# Patient Record
Sex: Female | Born: 1955 | Race: Black or African American | Hispanic: No | Marital: Married | Smoking: Former smoker
Health system: Southern US, Community
[De-identification: ages and names within clinical notes are randomized; demographics above are authoritative.]

## PROBLEM LIST (undated history)

## (undated) DIAGNOSIS — M069 Rheumatoid arthritis, unspecified: Secondary | ICD-10-CM

## (undated) DIAGNOSIS — T7840XA Allergy, unspecified, initial encounter: Secondary | ICD-10-CM

## (undated) DIAGNOSIS — M779 Enthesopathy, unspecified: Secondary | ICD-10-CM

## (undated) DIAGNOSIS — R7303 Prediabetes: Secondary | ICD-10-CM

## (undated) HISTORY — DX: Allergy, unspecified, initial encounter: T78.40XA

## (undated) HISTORY — PX: TUMOR REMOVAL: SHX12

## (undated) HISTORY — PX: WISDOM TOOTH EXTRACTION: SHX21

## (undated) HISTORY — PX: TONSILLECTOMY: SUR1361

## (undated) HISTORY — PX: HERNIA REPAIR: SHX51

---

## 2000-06-09 ENCOUNTER — Encounter: Payer: Self-pay | Admitting: *Deleted

## 2000-06-09 ENCOUNTER — Ambulatory Visit (HOSPITAL_COMMUNITY): Admission: RE | Admit: 2000-06-09 | Discharge: 2000-06-09 | Payer: Self-pay | Admitting: *Deleted

## 2001-06-29 ENCOUNTER — Ambulatory Visit (HOSPITAL_COMMUNITY): Admission: RE | Admit: 2001-06-29 | Discharge: 2001-06-29 | Payer: Self-pay | Admitting: *Deleted

## 2001-06-29 ENCOUNTER — Encounter: Payer: Self-pay | Admitting: *Deleted

## 2002-02-02 ENCOUNTER — Other Ambulatory Visit: Admission: RE | Admit: 2002-02-02 | Discharge: 2002-02-02 | Payer: Self-pay | Admitting: *Deleted

## 2002-02-17 ENCOUNTER — Encounter: Payer: Self-pay | Admitting: Family Medicine

## 2002-02-17 ENCOUNTER — Encounter: Admission: RE | Admit: 2002-02-17 | Discharge: 2002-02-17 | Payer: Self-pay | Admitting: Family Medicine

## 2004-08-06 ENCOUNTER — Inpatient Hospital Stay (HOSPITAL_BASED_OUTPATIENT_CLINIC_OR_DEPARTMENT_OTHER): Admission: RE | Admit: 2004-08-06 | Discharge: 2004-08-06 | Payer: Self-pay | Admitting: Interventional Cardiology

## 2005-03-03 ENCOUNTER — Encounter: Admission: RE | Admit: 2005-03-03 | Discharge: 2005-03-03 | Payer: Self-pay | Admitting: Obstetrics and Gynecology

## 2005-04-01 ENCOUNTER — Other Ambulatory Visit: Admission: RE | Admit: 2005-04-01 | Discharge: 2005-04-01 | Payer: Self-pay | Admitting: Obstetrics and Gynecology

## 2005-05-06 ENCOUNTER — Encounter: Admission: RE | Admit: 2005-05-06 | Discharge: 2005-05-06 | Payer: Self-pay | Admitting: Obstetrics and Gynecology

## 2005-07-02 ENCOUNTER — Encounter: Admission: RE | Admit: 2005-07-02 | Discharge: 2005-07-02 | Payer: Self-pay | Admitting: General Surgery

## 2005-07-03 ENCOUNTER — Ambulatory Visit (HOSPITAL_BASED_OUTPATIENT_CLINIC_OR_DEPARTMENT_OTHER): Admission: RE | Admit: 2005-07-03 | Discharge: 2005-07-03 | Payer: Self-pay | Admitting: General Surgery

## 2005-07-03 ENCOUNTER — Ambulatory Visit (HOSPITAL_COMMUNITY): Admission: RE | Admit: 2005-07-03 | Discharge: 2005-07-03 | Payer: Self-pay | Admitting: General Surgery

## 2005-07-07 ENCOUNTER — Emergency Department (HOSPITAL_COMMUNITY): Admission: EM | Admit: 2005-07-07 | Discharge: 2005-07-07 | Payer: Self-pay | Admitting: Emergency Medicine

## 2005-10-09 ENCOUNTER — Encounter: Admission: RE | Admit: 2005-10-09 | Discharge: 2005-10-09 | Payer: Self-pay | Admitting: Obstetrics and Gynecology

## 2006-03-05 ENCOUNTER — Encounter: Admission: RE | Admit: 2006-03-05 | Discharge: 2006-03-05 | Payer: Self-pay | Admitting: Obstetrics and Gynecology

## 2006-09-23 ENCOUNTER — Ambulatory Visit: Payer: Self-pay | Admitting: Family Medicine

## 2006-10-06 ENCOUNTER — Ambulatory Visit: Payer: Self-pay | Admitting: Gastroenterology

## 2006-10-20 ENCOUNTER — Ambulatory Visit: Payer: Self-pay | Admitting: Gastroenterology

## 2006-12-22 IMAGING — CR DG CHEST 2V
2 series · 2 of 2 positions shown · non-contrast
Comparison: none

CLINICAL DATA: Preoperative respiratory exam.  Ventral hernia. Cough.
 CHEST ? TWO VIEWS:
 The heart size and mediastinal contours are within normal limits.  Both lungs are clear.  The visualized skeletal structures are unremarkable.

[w chest pa]
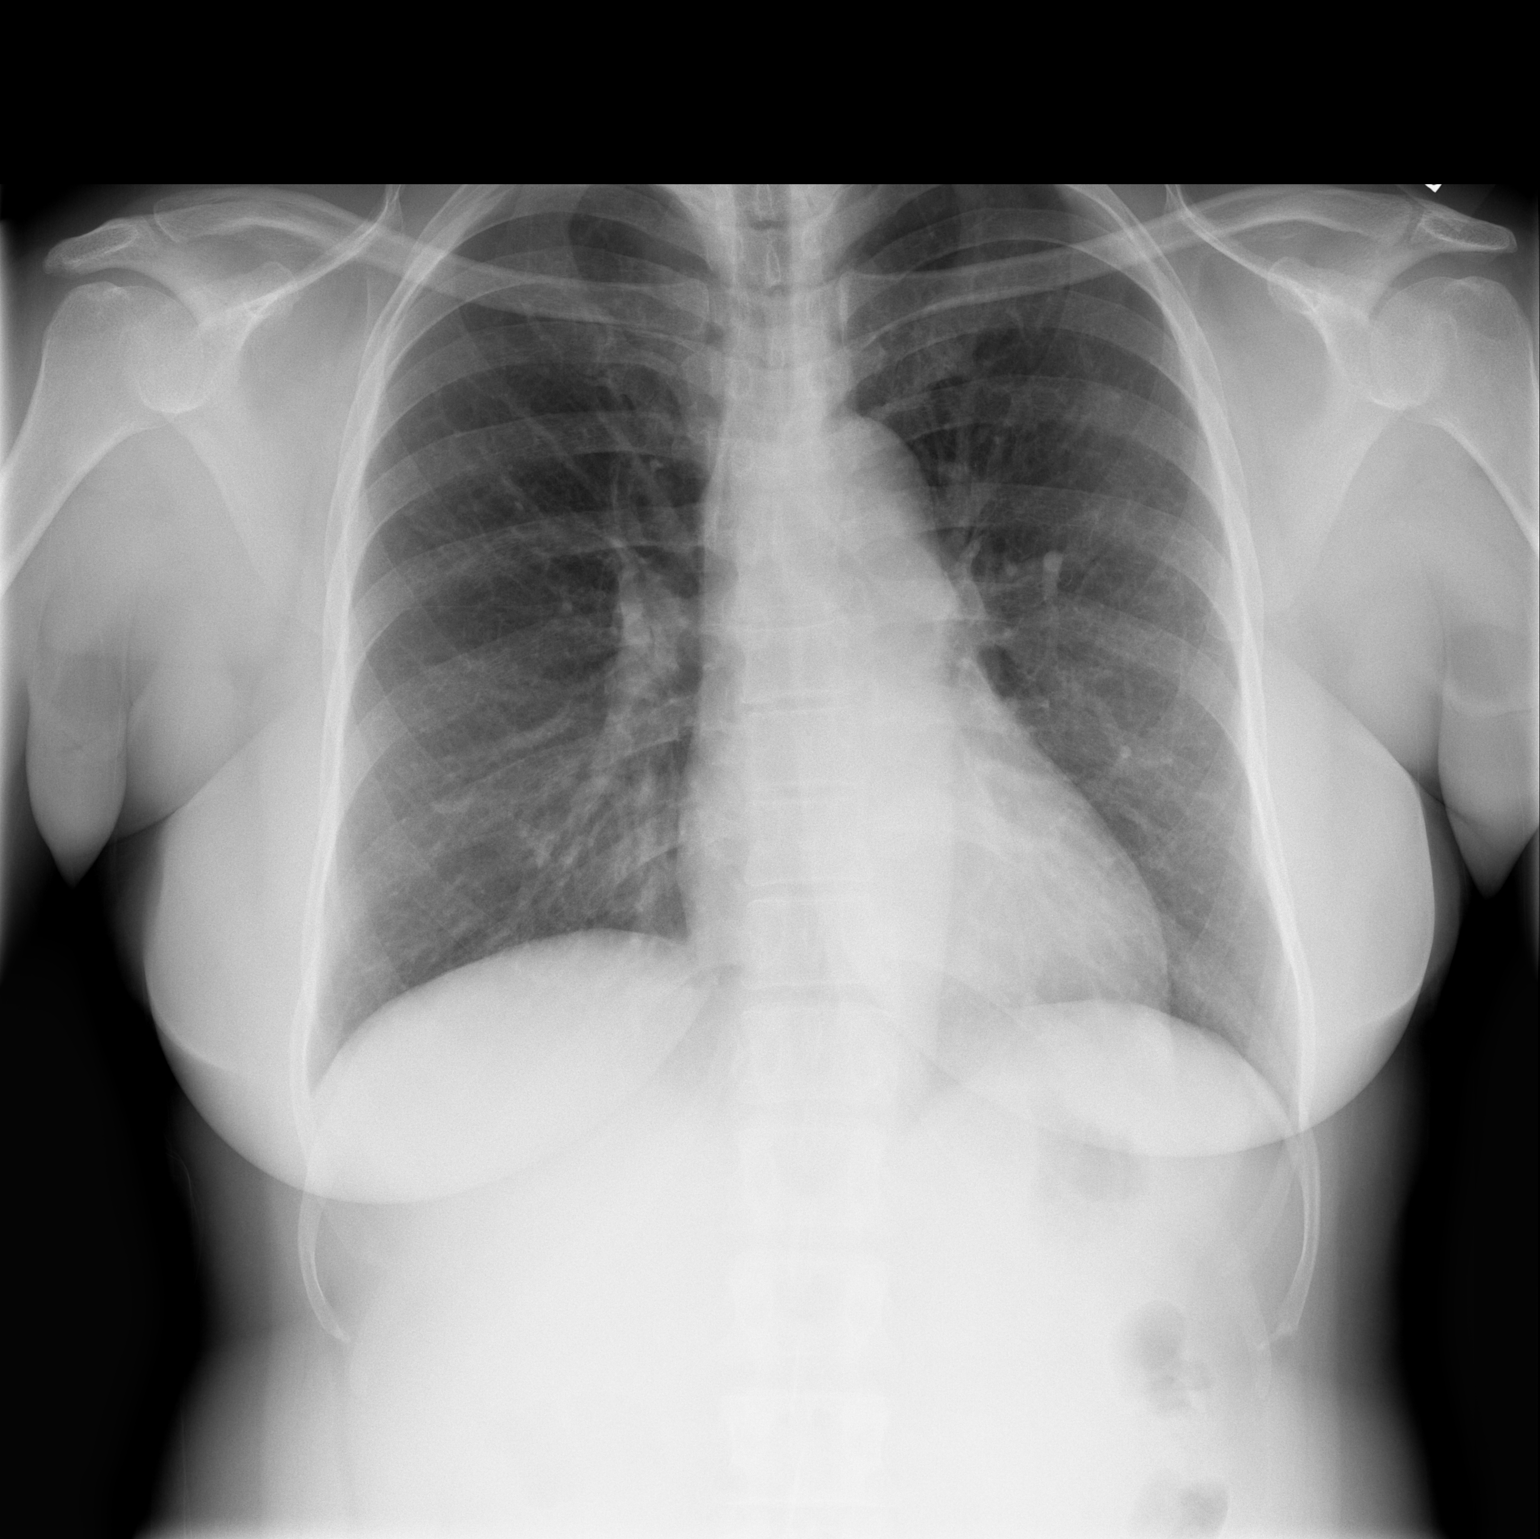

[w chest lat]
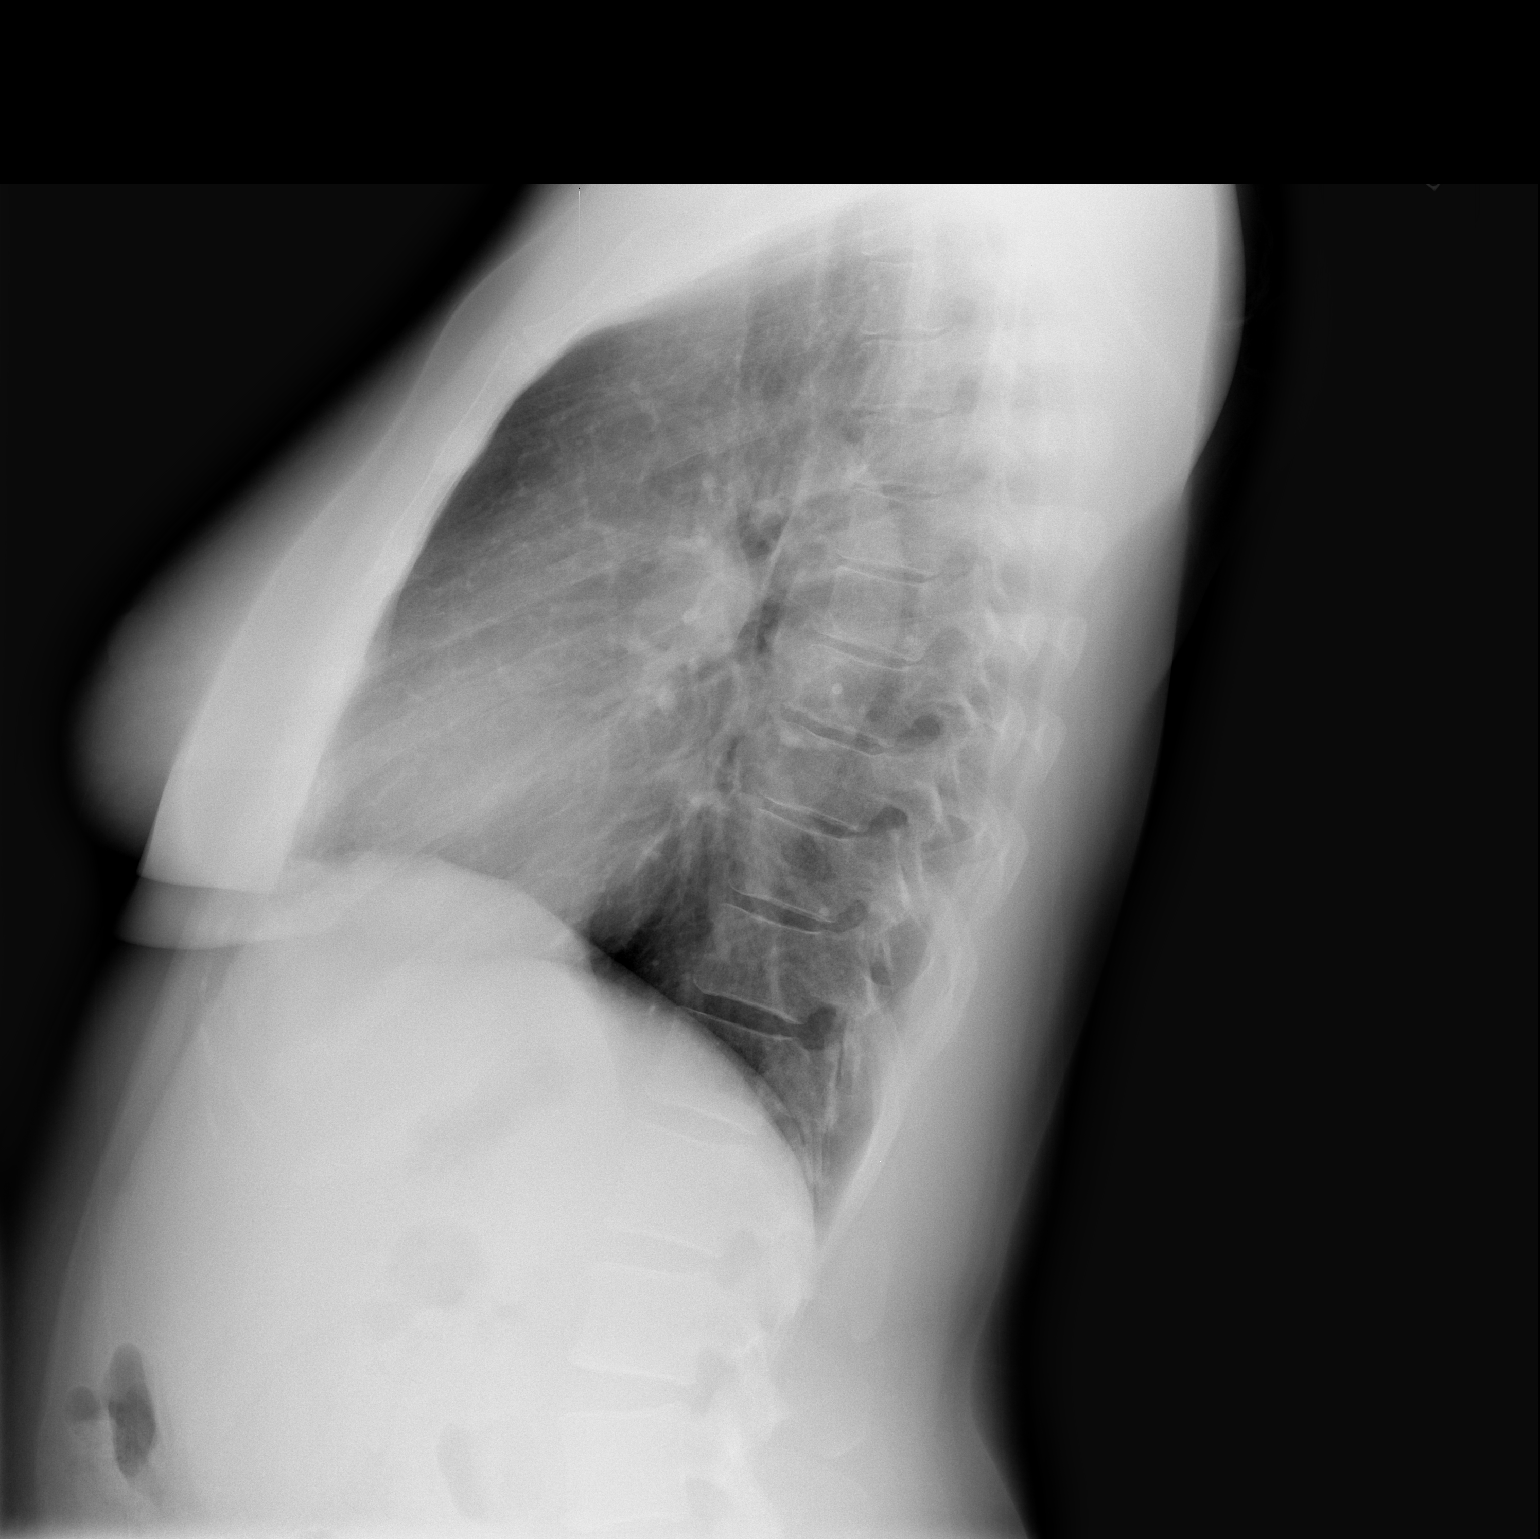

[2 of 2 positions shown; findings below may reference images not displayed]

IMPRESSION: No active cardiopulmonary disease.

## 2006-12-25 ENCOUNTER — Ambulatory Visit: Payer: Self-pay | Admitting: Family Medicine

## 2006-12-30 ENCOUNTER — Ambulatory Visit: Payer: Self-pay | Admitting: Family Medicine

## 2006-12-30 ENCOUNTER — Encounter: Admission: RE | Admit: 2006-12-30 | Discharge: 2006-12-30 | Payer: Self-pay | Admitting: Family Medicine

## 2006-12-30 LAB — CONVERTED CEMR LAB
Basophils Relative: 1.1 % — ABNORMAL HIGH (ref 0.0–1.0)
CO2: 29 meq/L (ref 19–32)
Calcium: 9.1 mg/dL (ref 8.4–10.5)
Creatinine, Ser: 0.6 mg/dL (ref 0.4–1.2)
Eosinophils Absolute: 0.2 10*3/uL (ref 0.0–0.6)
GFR calc Af Amer: 136 mL/min
GFR calc non Af Amer: 112 mL/min
Monocytes Absolute: 0.6 10*3/uL (ref 0.2–0.7)
Neutrophils Relative %: 59.5 % (ref 43.0–77.0)
Phosphorus: 3.6 mg/dL (ref 2.3–4.6)
Potassium: 4.2 meq/L (ref 3.5–5.1)
RBC: 4.85 M/uL (ref 3.87–5.11)
RDW: 13.3 % (ref 11.5–14.6)
WBC: 8.7 10*3/uL (ref 4.5–10.5)

## 2006-12-31 ENCOUNTER — Encounter (INDEPENDENT_AMBULATORY_CARE_PROVIDER_SITE_OTHER): Payer: Self-pay | Admitting: Family Medicine

## 2006-12-31 LAB — CONVERTED CEMR LAB
Hemoglobin, Urine: NEGATIVE
Ketones, ur: NEGATIVE mg/dL
Nitrite: NEGATIVE
Urine Glucose: NEGATIVE mg/dL
Urobilinogen, UA: 0.2 (ref 0.0–1.0)

## 2007-01-14 ENCOUNTER — Ambulatory Visit: Payer: Self-pay | Admitting: Family Medicine

## 2007-01-15 ENCOUNTER — Encounter (INDEPENDENT_AMBULATORY_CARE_PROVIDER_SITE_OTHER): Payer: Self-pay | Admitting: Family Medicine

## 2007-01-15 LAB — CONVERTED CEMR LAB
Bilirubin Urine: NEGATIVE
Protein, ur: NEGATIVE mg/dL
Specific Gravity, Urine: 1.027 (ref 1.005–1.03)
Urine Glucose: NEGATIVE mg/dL
Urobilinogen, UA: 0.2 (ref 0.0–1.0)

## 2007-01-16 ENCOUNTER — Encounter (INDEPENDENT_AMBULATORY_CARE_PROVIDER_SITE_OTHER): Payer: Self-pay | Admitting: Family Medicine

## 2007-01-26 ENCOUNTER — Telehealth (INDEPENDENT_AMBULATORY_CARE_PROVIDER_SITE_OTHER): Payer: Self-pay | Admitting: *Deleted

## 2007-01-27 DIAGNOSIS — J309 Allergic rhinitis, unspecified: Secondary | ICD-10-CM | POA: Insufficient documentation

## 2007-01-27 DIAGNOSIS — K439 Ventral hernia without obstruction or gangrene: Secondary | ICD-10-CM | POA: Insufficient documentation

## 2007-03-09 ENCOUNTER — Encounter: Admission: RE | Admit: 2007-03-09 | Discharge: 2007-03-09 | Payer: Self-pay | Admitting: Obstetrics and Gynecology

## 2007-11-29 ENCOUNTER — Telehealth (INDEPENDENT_AMBULATORY_CARE_PROVIDER_SITE_OTHER): Payer: Self-pay | Admitting: *Deleted

## 2008-03-21 ENCOUNTER — Ambulatory Visit (HOSPITAL_COMMUNITY): Admission: RE | Admit: 2008-03-21 | Discharge: 2008-03-21 | Payer: Self-pay | Admitting: Obstetrics and Gynecology

## 2009-04-09 ENCOUNTER — Telehealth (INDEPENDENT_AMBULATORY_CARE_PROVIDER_SITE_OTHER): Payer: Self-pay | Admitting: *Deleted

## 2011-01-17 NOTE — Assessment & Plan Note (Signed)
[facility]                        [facility] OFFICE NOTE   NAME:Laura Laura [name], Laura Laura [name]                      MRN:          [id number]  DATE:12/30/2006                            DOB:          September 28, 1955    REASON FOR VISIT:  Followup of left back pain.   Ms. Laura Laura [name] is Laura [name] 55 year old female who was seen by Dr. Laury [name] on December 25, 2006.  At that point, she was diagnosed with sinusitis and UTI.  Her  sinusitis symptoms resolved, but she continued to have back pain.  She  was subsequently seen at [facility], where they treated  her with Vicodin and also started her on lisinopril for her proteinuria.  The patient presents today reporting that the symptoms continue.  They  are not severe in nature.  She denies any urinary symptoms.  Symptoms  have been present for about 8 days.  The patient denies any nausea,  vomiting, fever or chills.  She has no other complaints.   MEDICATIONS:  1. Vicodin.  2. Allegra-D q.12 h.  3. Lisinopril 20 mg daily, started 4 days ago.  4. Rhinocort Aqua.   ALLERGIES:  No known drug allergies.   REVIEW OF SYSTEMS:  As per HPI, otherwise unremarkable.   OBJECTIVE:  Weight 174.6, temperature 98.2, pulse 74, blood pressure  110/74.  GENERAL:  We have Laura [name] pleasant female in no acute distress, answers  questions appropriately, alert and oriented x3.  BACK:  Significant for no midline tenderness.  There is no CVA  tenderness, but she does report that her discomfort is in the left upper  flank.  I noticed some discomfort with deep inspiration or rotation of  the upper torso.  LUNGS:  Clear.  NEUROLOGIC:  Nonfocal.   IMPRESSION:  [age]-year-old female with upper flank pain, previously  diagnosed with Laura [name] urinary tract infection.  She also was noted to have  proteinuria on Laura [name] recent specimen at [facility].   PLAN:  1. I advised the patient to currently discontinue the lisinopril.  2. We will  check Laura [name] UA and urine culture.  3. We will also check Laura [name] BMET.  4. We will also check Laura [name] CBC.  5. We will obtain Laura [name] chest x-ray as well as thoracic spine film.  6. I advised the patient if her symptoms worsen in any way, she is to      follow up; otherwise, she      has an appointment scheduled for 1 week.  7. Further recommendations after review of the above.     Leanne Chang, M.D.  Electronically Signed    LA/MedQ  DD: 12/30/2006  DT: 12/30/2006  Job #: [id number]

## 2011-01-17 NOTE — Cardiovascular Report (Signed)
Laura [name], Laura [name]               ACCOUNT NO.:  [id number]   MEDICAL RECORD NO.:  [id number]          PATIENT TYPE:  OIB   LOCATION:  6501                         FACILITY:  [facility]   PHYSICIAN:  Lyn Records III, M.D.DATE OF BIRTH:  06-05-1956   DATE OF PROCEDURE:  08/06/2004  DATE OF DISCHARGE:                              CARDIAC CATHETERIZATION   INDICATION:  Abnormal Cardiolite.   PROCEDURE PERFORMED:  1.  Left heart catheterization.  2.  Selective coronary angiography.  3.  Left ventriculography.   DESCRIPTION:  After informed consent, a 4-French sheath was placed in the  right femoral artery using modified Seldinger technique.  A 4-French A2  multipurpose catheter was used for hemodynamic recordings, left  ventriculography by hand injection, and right coronary angiography.  A 4-  [place] #4 left Judkins catheter was used for left coronary angiography.  The  patient tolerated the procedure without complications.   RESULTS:   I. HEMODYNAMIC DATA:  A.  Aortic pressure 116/71.  B.  Left ventricular pressure 121/7.   II. LEFT VENTRICULOGRAPHY:  The left ventricle demonstrates normal cavity  size, low normal overall left ventricular function.  EF 50%.  No MR.   III. CORONARY ANGIOGRAPHY:  A.  Left main coronary:  Normal.  B.  Left anterior descending coronary:  Minimal luminal irregularities.  There is a large diagonal that arises from the proximal portion of the  vessel.  The LAD wraps around the left ventricular apex.  The large diagonal  also reaches the inferolateral wall.  No significant obstruction is noted in  the LAD.  C.  Circumflex artery:  Circumflex is a large, dominant vessel giving three  obtuse marginal branches.  The last of which reaches the inferolateral wall.  Circumflex is normal.  D.  Right coronary:  The right coronary contains luminal irregularities in  the mid vessel.  Gives origin to a large near co-dominant acute marginal  branch.  It also gives a  small PDA and supplies the inferobasal region.   CONCLUSIONS:  1.  Essentially normal coronaries.  2.  Normal left ventricular function.  3.  False-positive Cardiolite study.       HWS/MEDQ  D:  08/06/2004  T:  08/06/2004  Job:  [id number]   cc:   Maryla Morrow. Modesto Charon, M.D.  [address]  [place]  [place] [address]  Fax: [phone]

## 2011-01-17 NOTE — Op Note (Signed)
NAMEWINSLOW, VERRILL               ACCOUNT NO.:  [id number]   MEDICAL RECORD NO.:  [id number]          PATIENT TYPE:  AMB   LOCATION:  [facility]                         FACILITY:  [facility]   PHYSICIAN:  Leonie Man, M.D.   DATE OF BIRTH:  [birth year]   DATE OF PROCEDURE:  07/03/2005  DATE OF DISCHARGE:                                 OPERATIVE REPORT   PREOPERATIVE DIAGNOSIS:  Ventral hernia   POSTOPERATIVE DIAGNOSIS:  Ventral hernia   PROCEDURE:  Repair ventral hernia with mesh (Kugel patch).   SURGEON:  Dr. Lurene Shadow   ASSISTANT:  OR tech.   ANESTHESIA:  General.   SPECIMENS TO LAB:  None.   ESTIMATED BLOOD LOSS:  Minimal.   COMPLICATIONS:  None.  The patient to the PACU in excellent condition.   The patient is a 55 year old female with an intermittently incarcerating  epigastric hernia.  On palpation of this, she also has just below it a small  umbilical hernia.  The patient comes to the operating room for repair of  this hernia.   PROCEDURE:  Following induction of satisfactory general anesthesia with the  patient positioned supinely, the abdomen is prepped and draped to be  included in a sterile operative field.  A vertical incision is carried down  above the umbilicus and just to the upper edge of the umbilicus, and this  was deepened through the skin, subcutaneous tissue, dissection carried down  to the hernia.  The hernia is divided on all sides around the falciform  ligament is dissected free and reduced back into the peritoneal cavity.  On  palpation, more inferiorly, the umbilical hernia could be felt, and the  incision is extended into the umbilical hernia.  I used a large round Kugel  patch to bridge both gaps, and this was sewn in by interrupted Novofil  sutures placed in the 12 o'clock, 6 o'clock, 3 o'clock, 9 o'clock, 10  o'clock, 2 o'clock, 8 o'clock, and 4 o'clock positions.  The mesh was seated  well in place.  Sponge, instruments, and sharp counts were  verified.  The  subcutaneous tissues are then closed with interrupted 2-0 Vicryl sutures.  Skin  closed with a running 4-0 Monocryl suture and then reinforced with Steri-  Strips.  Sterile dressing is applied.  Anesthetic reversed.  The patient  removed from the operating room to the recovery room in stable condition.  She tolerated the procedure well.      Leonie Man, M.D.  Electronically Signed     PB/MEDQ  D:  07/03/2005  T:  07/03/2005  Job:  [id number]

## 2011-01-17 NOTE — Assessment & Plan Note (Signed)
[facility]                        [facility] OFFICE NOTE   NAME:Folkerts, Berta A                      MRN:          [id number]  DATE:09/23/2006                            DOB:          [birth year]    REASON FOR VISIT:  Establish care. Laura [name] is a 55 year old female  who previously was seen at [facility] here to  establish care. She has no significant medical history except for  allergic rhinitis. She is currently taking Rhinocort and Allegra D.   Additionally she had noted a rash on the inside of her upper arm. It has  been present for several months with no significant changes. Completely  asymptomatic.   PAST MEDICAL HISTORY:  1. Allergic rhinitis.  2. A ventral hernia status post hernia repair November 2006.   SURGICAL HISTORY:  1. In 1973, removal of a fatty tumor.  2. In 1997,C-section.  3. In 1969, tonsillectomy.  4. November 2006, ventral hernia repair.   MEDICATIONS:  1. Rhinocort.  2. Allegra D.   ALLERGIES:  No known drug allergies.   FAMILY HISTORY:  Mother passed away secondary to a perforated colon.  Father passed away secondary to lung cancer. She has 1 sister who has a  history of thyroid disease.   SOCIAL HISTORY:  She is married with 1 child. She works for the [employer]. She was born in [place], [place]. She smokes half a  pack a day and drinks a 6 pack of Bud Light weekly.   HEALTH MAINTENANCE:  The patient gets her annual gyn exam and mammogram  followup with her gynecologist. She has not had a colonoscopy.   REVIEW OF SYSTEMS:  As per HPI, otherwise unremarkable.   OBJECTIVE:  Weight 175, pulse 82, blood pressure 118/80.  GENERAL:  We have a pleasant female in no acute distress, answers  questions appropriately.  HEENT: Tympanic membranes were both clear bilaterally.  Nasal mucosa  slightly boggy, cobblestone appearing. Oropharynx is benign.  LUNGS: Clear.  HEART:  Regular rate and rhythm, normal S1, S2, no murmurs, gallops, or  rubs.  SKIN EXAMINATION: Significant for a well-circumscribed, slightly raised,  scaly, plaque on the right upper arm on the medial aspect of the biceps.   IMPRESSION:  1. Allergic rhinitis.  2. Possible tinea corporis.   PLAN:  1. Refill patient's Allegra D 1 p.o. b.i.d. 60 with 6 refills.  2. Rhinocort 3 squirts to each nostril q.d. 1 with 6 refills.  3. Spectazole cream to apply to the affected area b.i.d. for 14 days.      If no significant improvement, she is to call for further      recommendations.  4. Regarding her health maintenance, I will refer her for a      colonoscopy given that she is over the age of 89. The patient      expressed understanding.     Leanne Chang, M.D.  Electronically Signed    LA/MedQ  DD: 09/23/2006  DT: 09/23/2006  Job #: [id number]

## 2012-08-04 ENCOUNTER — Ambulatory Visit (INDEPENDENT_AMBULATORY_CARE_PROVIDER_SITE_OTHER): Payer: Federal, State, Local not specified - PPO | Admitting: Obstetrics and Gynecology

## 2012-08-04 ENCOUNTER — Encounter: Payer: Self-pay | Admitting: Obstetrics and Gynecology

## 2012-08-04 VITALS — BP 118/80 | Ht 67.0 in | Wt 180.0 lb

## 2012-08-04 DIAGNOSIS — I471 Supraventricular tachycardia, unspecified: Secondary | ICD-10-CM

## 2012-08-04 DIAGNOSIS — Z01419 Encounter for gynecological examination (general) (routine) without abnormal findings: Secondary | ICD-10-CM

## 2012-08-04 DIAGNOSIS — I4719 Other supraventricular tachycardia: Secondary | ICD-10-CM | POA: Insufficient documentation

## 2012-08-04 DIAGNOSIS — Z780 Asymptomatic menopausal state: Secondary | ICD-10-CM

## 2012-08-04 NOTE — Progress Notes (Signed)
Last Pap: 07/2010 WNL: Yes Regular Periods:no pm Contraception: none  Monthly Breast exam:no Tetanus<30yrs:yes Nl.Bladder Function:yes Daily BMs:yes Healthy Diet:yes Calcium:yes Mammogram:yes Date of Mammogram: 07/19/2012 Exercise:yes Have often Exercise: 1 time a week  Seatbelt: yes Abuse at home: no Stressful work:no Sigmoid-colonoscopy: 2009 per pt  Bone Density: No PCP: Dr.Ameen  Cardiologist: Dr. Verdis Prime Change in PMH: unchanged  Change in ZOX:[id number]  Subjective:    Laura [name] is a 56 y.o. female G4P1001 who presents for annual exam.  TakingToprol XL PRN for PAT.  C/o intermittent hemorrhoids, no bleeding with sx responsive to OTC meds.  The following portions of the patient's history were reviewed and updated as appropriate: allergies, current medications, past family history, past medical history, past social history, past surgical history and problem list.  Review of Systems Pertinent items are noted in HPI. Gastrointestinal:No change in bowel habits, no abdominal pain, no rectal bleeding Genitourinary:negative for dysuria, frequency, hematuria, nocturia and urinary incontinence    Objective:     BP 118/80  Ht 5\' 7"  (1.702 m)  Wt 180 lb (81.647 kg)  BMI 28.19 kg/m2  Weight:  Wt Readings from Last 1 Encounters:  08/04/12 180 lb (81.647 kg)     BMI: Body mass index is 28.19 kg/(m^2). General Appearance: Alert, appropriate appearance for age. No acute distress HEENT: Grossly normal Neck / Thyroid: Supple, no masses, nodes or enlargement Lungs: clear to auscultation bilaterally Back: No CVA tenderness Breast Exam: No masses or nodes.No dimpling, nipple retraction or discharge. Cardiovascular: Regular rate and rhythm. S1, S2, no murmur Gastrointestinal: Soft, non-tender, no masses or organomegaly Pelvic Exam: Vulva and vagina appear normal. Bimanual exam reveals normal uterus and adnexa. Rectovaginal: normal rectal, no masses Lymphatic Exam:  Non-palpable nodes in neck, clavicular, axillary, or inguinal regions Skin: no rash or abnormalities Neurologic: Normal gait and speech, no tremor  Psychiatric: Alert and oriented, appropriate affect.    Urinalysis:Not done    Assessment:    Menopause without sx   Plan:   mammogram pap smear due 2014 return annually or prn

## 2013-05-20 ENCOUNTER — Encounter (HOSPITAL_BASED_OUTPATIENT_CLINIC_OR_DEPARTMENT_OTHER): Payer: Self-pay | Admitting: *Deleted

## 2013-05-20 DIAGNOSIS — Z7982 Long term (current) use of aspirin: Secondary | ICD-10-CM | POA: Insufficient documentation

## 2013-05-20 DIAGNOSIS — M7500 Adhesive capsulitis of unspecified shoulder: Secondary | ICD-10-CM | POA: Insufficient documentation

## 2013-05-20 DIAGNOSIS — Z79899 Other long term (current) drug therapy: Secondary | ICD-10-CM | POA: Insufficient documentation

## 2013-05-20 DIAGNOSIS — F17200 Nicotine dependence, unspecified, uncomplicated: Secondary | ICD-10-CM | POA: Insufficient documentation

## 2013-05-20 NOTE — ED Notes (Addendum)
Pt c/o left arm and shoulder pain w/o injury x 1 day pt received flu shot x 10 days ago

## 2013-05-21 ENCOUNTER — Emergency Department (HOSPITAL_BASED_OUTPATIENT_CLINIC_OR_DEPARTMENT_OTHER): Payer: Federal, State, Local not specified - PPO

## 2013-05-21 ENCOUNTER — Emergency Department (HOSPITAL_BASED_OUTPATIENT_CLINIC_OR_DEPARTMENT_OTHER)
Admission: EM | Admit: 2013-05-21 | Discharge: 2013-05-21 | Disposition: A | Discharge status: Home / Self Care | Payer: Federal, State, Local not specified - PPO | Attending: Emergency Medicine | Admitting: Emergency Medicine

## 2013-05-21 DIAGNOSIS — M7552 Bursitis of left shoulder: Secondary | ICD-10-CM

## 2013-05-21 MED ORDER — METHOCARBAMOL 500 MG PO TABS
1000.0000 mg | ORAL_TABLET | Freq: Once | ORAL | Status: AC
  Administered 2013-05-21: 1000 mg via ORAL
  Filled 2013-05-21: qty 2

## 2013-05-21 MED ORDER — IBUPROFEN 800 MG PO TABS: 800.0000 mg | ORAL_TABLET | Freq: Three times a day (TID) | ORAL | Status: AC

## 2013-05-21 MED ORDER — TRAMADOL HCL 50 MG PO TABS
50.0000 mg | ORAL_TABLET | Freq: Once | ORAL | Status: AC
  Administered 2013-05-21: 50 mg via ORAL
  Filled 2013-05-21: qty 1

## 2013-05-21 MED ORDER — TRAMADOL HCL 50 MG PO TABS: 50.0000 mg | ORAL_TABLET | Freq: Four times a day (QID) | ORAL | Status: AC | PRN

## 2013-05-21 MED ORDER — KETOROLAC TROMETHAMINE 60 MG/2ML IM SOLN
60.0000 mg | Freq: Once | INTRAMUSCULAR | Status: AC
  Administered 2013-05-21: 60 mg via INTRAMUSCULAR
  Filled 2013-05-21: qty 2

## 2013-05-21 NOTE — ED Notes (Signed)
MD at bedside giving test results and instructions for dispo.

## 2013-05-21 NOTE — ED Provider Notes (Signed)
CSN: [id number]     Arrival date & time 05/20/13  2344 History   First MD Initiated Contact with Patient 05/21/13 0008     Chief Complaint  Patient presents with  . Shoulder Pain   (Consider location/radiation/quality/duration/timing/severity/associated sxs/prior Treatment) Patient is a 57 y.o. female presenting with shoulder pain. The history is provided by the patient.  Shoulder Pain This is a new problem. The current episode started 6 to 12 hours ago. The problem occurs constantly. The problem has not changed since onset.Pertinent negatives include no chest pain, no abdominal pain, no headaches and no shortness of breath. Exacerbated by: moving the arm. Nothing relieves the symptoms. She has tried nothing for the symptoms. The treatment provided no relief.  No trauma but feels unable to move the arm  Past Medical History  Diagnosis Date  . Allergy    Past Surgical History  Procedure Laterality Date  . Cesarean section    . Wisdom tooth extraction    . Tonsillectomy    . Hernia repair    . Tumor removal     Family History  Problem Relation Age of Onset  . Hypertension Mother    History  Substance Use Topics  . Smoking status: Current Every Day Smoker    Types: Cigarettes  . Smokeless tobacco: Never Used  . Alcohol Use: 3.6 oz/week    6 Cans of beer per week   OB History   Grav Para Term Preterm Abortions TAB SAB Ect Mult Living   4 1 1       1      Review of Systems  Respiratory: Negative for shortness of breath.   Cardiovascular: Negative for chest pain.  Gastrointestinal: Negative for abdominal pain.  Neurological: Negative for headaches.  All other systems reviewed and are negative.    Allergies  Review of patient's allergies indicates no known allergies.  Home Medications   Current Outpatient Rx  Name  Route  Sig  Dispense  Refill  . aspirin 81 MG tablet   Oral   Take 81 mg by mouth daily.         Marland Kitchen guaiFENesin (MUCINEX) 600 MG 12 hr tablet    Oral   Take 1,200 mg by mouth 2 (two) times daily.         . metoprolol succinate (TOPROL-XL) 50 MG 24 hr tablet   Oral   Take 50 mg by mouth daily. Take with or immediately following a meal.          BP 160/86  Pulse 77  Temp(Src) 98.6 F (37 C) (Oral)  Resp 18  Ht 5\' 8"  (1.727 m)  Wt 177 lb (80.287 kg)  BMI 26.92 kg/m2  SpO2 98% Physical Exam  Constitutional: She is oriented to person, place, and time. She appears well-developed. No distress.  HENT:  Head: Normocephalic and atraumatic.  Mouth/Throat: Oropharynx is clear and moist.  Eyes: Conjunctivae are normal. Pupils are equal, round, and reactive to light.  Neck: Normal range of motion. Neck supple.  Cardiovascular: Normal rate, regular rhythm and intact distal pulses.   Pulmonary/Chest: Effort normal and breath sounds normal. She has no wheezes. She has no rales.  Abdominal: Soft. Bowel sounds are normal. There is no tenderness. There is no rebound and no guarding.  Musculoskeletal:       Left shoulder: She exhibits decreased range of motion, tenderness and pain. She exhibits no bony tenderness, no swelling and normal strength.  Neurological: She is alert and oriented to  person, place, and time. She has normal reflexes.  Skin: Skin is warm and dry.  Psychiatric: She has a normal mood and affect.    ED Course  Procedures (including critical care time) Labs Review Labs Reviewed - No data to display Imaging Review No results found.  MDM  No diagnosis found. neers test negative symptoms consistent with bursitis will treat follow up with your doctor for ongoing care    Vivica Dobosz K Marsela Kuan-Rasch, MD 05/21/13 [phone]

## 2013-05-21 NOTE — ED Notes (Signed)
MD at bedside. 

## 2013-07-15 ENCOUNTER — Emergency Department (HOSPITAL_BASED_OUTPATIENT_CLINIC_OR_DEPARTMENT_OTHER)
Admission: EM | Admit: 2013-07-15 | Discharge: 2013-07-15 | Disposition: A | Discharge status: Home / Self Care | Payer: Federal, State, Local not specified - PPO | Attending: Emergency Medicine | Admitting: Emergency Medicine

## 2013-07-15 ENCOUNTER — Encounter (HOSPITAL_BASED_OUTPATIENT_CLINIC_OR_DEPARTMENT_OTHER): Payer: Self-pay | Admitting: Emergency Medicine

## 2013-07-15 DIAGNOSIS — Y9389 Activity, other specified: Secondary | ICD-10-CM | POA: Insufficient documentation

## 2013-07-15 DIAGNOSIS — S96911A Strain of unspecified muscle and tendon at ankle and foot level, right foot, initial encounter: Secondary | ICD-10-CM

## 2013-07-15 DIAGNOSIS — Y9289 Other specified places as the place of occurrence of the external cause: Secondary | ICD-10-CM | POA: Insufficient documentation

## 2013-07-15 DIAGNOSIS — Z7982 Long term (current) use of aspirin: Secondary | ICD-10-CM | POA: Insufficient documentation

## 2013-07-15 DIAGNOSIS — Z79899 Other long term (current) drug therapy: Secondary | ICD-10-CM | POA: Insufficient documentation

## 2013-07-15 DIAGNOSIS — T733XXA Exhaustion due to excessive exertion, initial encounter: Secondary | ICD-10-CM | POA: Insufficient documentation

## 2013-07-15 DIAGNOSIS — S93609A Unspecified sprain of unspecified foot, initial encounter: Secondary | ICD-10-CM | POA: Insufficient documentation

## 2013-07-15 DIAGNOSIS — Z87891 Personal history of nicotine dependence: Secondary | ICD-10-CM | POA: Insufficient documentation

## 2013-07-15 DIAGNOSIS — IMO0002 Reserved for concepts with insufficient information to code with codable children: Secondary | ICD-10-CM | POA: Insufficient documentation

## 2013-07-15 DIAGNOSIS — M069 Rheumatoid arthritis, unspecified: Secondary | ICD-10-CM | POA: Insufficient documentation

## 2013-07-15 HISTORY — DX: Enthesopathy, unspecified: M77.9

## 2013-07-15 HISTORY — DX: Rheumatoid arthritis, unspecified: M06.9

## 2013-07-15 HISTORY — DX: Prediabetes: R73.03

## 2013-07-15 MED ORDER — IBUPROFEN 800 MG PO TABS: 800.0000 mg | ORAL_TABLET | Freq: Three times a day (TID) | ORAL | Status: DC

## 2013-07-15 MED ORDER — HYDROCODONE-ACETAMINOPHEN 5-325 MG PO TABS
1.0000 | ORAL_TABLET | Freq: Once | ORAL | Status: AC
  Administered 2013-07-15: 1 via ORAL
  Filled 2013-07-15: qty 1

## 2013-07-15 MED ORDER — HYDROCODONE-ACETAMINOPHEN 5-500 MG PO TABS: 1.0000 | ORAL_TABLET | ORAL | Status: AC | PRN

## 2013-07-15 MED ORDER — IBUPROFEN 800 MG PO TABS
800.0000 mg | ORAL_TABLET | Freq: Once | ORAL | Status: AC
  Administered 2013-07-15: 800 mg via ORAL
  Filled 2013-07-15: qty 1

## 2013-07-15 NOTE — ED Provider Notes (Signed)
I have reviewed the report and personally reviewed the above radiology studies.    Hilario Quarry, MD 07/15/13 2158

## 2013-07-15 NOTE — ED Notes (Signed)
Pt reports worked today, went shopping afterwards with boots on.  Since this time with pain in right foot, no injury, diffuse.

## 2013-07-15 NOTE — ED Provider Notes (Signed)
CSN: [id number]     Arrival date & time 07/15/13  2042 History   First MD Initiated Contact with Patient 07/15/13 2054     Chief Complaint  Patient presents with  . Foot Pain   (Consider location/radiation/quality/duration/timing/severity/associated sxs/prior Treatment) Patient is a 57 y.o. female presenting with lower extremity pain.  Foot Pain This is a new problem. The current episode started today. Pertinent negatives include no chills, fever or numbness. Associated symptoms comments: Right foot pain that is generalized without direct injury. She reports she worked today as usual, then went shopping and was constantly on her feet for several hours in new flat-heeled boots.  No left foot pain. No twisting injury or fall..    Past Medical History  Diagnosis Date  . Allergy   . Rheumatoid arthritis   . Prediabetes   . Bone spur    Past Surgical History  Procedure Laterality Date  . Cesarean section    . Wisdom tooth extraction    . Tonsillectomy    . Hernia repair    . Tumor removal     Family History  Problem Relation Age of Onset  . Hypertension Mother    History  Substance Use Topics  . Smoking status: Former Smoker    Types: Cigarettes  . Smokeless tobacco: Never Used  . Alcohol Use: No   OB History   Grav Para Term Preterm Abortions TAB SAB Ect Mult Living   4 1 1       1      Review of Systems  Constitutional: Negative for fever and chills.  Cardiovascular: Negative for leg swelling.  Musculoskeletal:       See HPI.  Skin: Negative.  Negative for wound.  Neurological: Negative.  Negative for numbness.    Allergies  Review of patient's allergies indicates no known allergies.  Home Medications   Current Outpatient Rx  Name  Route  Sig  Dispense  Refill  . folic acid (FOLVITE) 1 MG tablet   Oral   Take 1 mg by mouth daily.         . hydroxychloroquine (PLAQUENIL) 200 MG tablet   Oral   Take 200 mg by mouth daily.         . methotrexate  (RHEUMATREX) 2.5 MG tablet   Oral   Take 2.5 mg by mouth once a week. Caution:Chemotherapy. Protect from light.         . Multiple Vitamin (MULTIVITAMIN) tablet   Oral   Take 1 tablet by mouth daily.         . predniSONE (DELTASONE) 10 MG tablet   Oral   Take 10 mg by mouth daily with breakfast.         . aspirin 81 MG tablet   Oral   Take 81 mg by mouth daily.         Marland Kitchen guaiFENesin (MUCINEX) 600 MG 12 hr tablet   Oral   Take 1,200 mg by mouth 2 (two) times daily.         Marland Kitchen ibuprofen (ADVIL,MOTRIN) 800 MG tablet   Oral   Take 1 tablet (800 mg total) by mouth 3 (three) times daily.   21 tablet   0   . metoprolol succinate (TOPROL-XL) 50 MG 24 hr tablet   Oral   Take 50 mg by mouth daily. Take with or immediately following a meal.         . traMADol (ULTRAM) 50 MG tablet   Oral  Take 1 tablet (50 mg total) by mouth every 6 (six) hours as needed for pain.   15 tablet   0    BP 140/77  Pulse 72  Temp(Src) 98.8 F (37.1 C) (Oral)  Resp 20  Ht 5\' 7"  (1.702 m)  Wt 170 lb (77.111 kg)  BMI 26.62 kg/m2  SpO2 98% Physical Exam  Constitutional: She is oriented to person, place, and time. She appears well-developed and well-nourished. No distress.  Neck: Normal range of motion.  Pulmonary/Chest: Effort normal.  Musculoskeletal:  Right foot without discoloration, bony deformity or swelling. Tender distal forefoot to dorsal and plantar surfaces. FROM. Ankle joint stable, nontender.  Neurological: She is alert and oriented to person, place, and time.  Skin: Skin is warm and dry.    ED Course  Procedures (including critical care time) Labs Review Labs Reviewed - No data to display Imaging Review No results found.  EKG Interpretation   None       MDM  No diagnosis found. 1. Right foot sprain  Soft tissue injury without suspect fracture. Supportive care, pain management. She has crutches with her.     Arnoldo Hooker, PA-C 07/15/13 2135

## 2013-07-15 NOTE — ED Notes (Signed)
Spoke with pharmacy regarding pt's RX for lortab 5/500. Product no longer available so wanted to sub lortab 5/325. Okay'ed per vorb from Dr Rosalia Hammers

## 2014-05-30 ENCOUNTER — Encounter: Payer: Self-pay | Admitting: Internal Medicine

## 2014-05-30 ENCOUNTER — Ambulatory Visit (INDEPENDENT_AMBULATORY_CARE_PROVIDER_SITE_OTHER): Payer: Federal, State, Local not specified - PPO | Admitting: Internal Medicine

## 2014-05-30 VITALS — BP 112/68 | HR 65 | Temp 97.4°F | Resp 12 | Ht 66.0 in | Wt 160.0 lb

## 2014-05-30 DIAGNOSIS — E041 Nontoxic single thyroid nodule: Secondary | ICD-10-CM | POA: Insufficient documentation

## 2014-05-30 NOTE — Patient Instructions (Signed)
Please ask your PCP to check the following tests: TSH, free t4, free t3.  Please try to join MyChart for easier communication.   We will most likely need to obtain a thyroid biopsy - this is done downstairs from my office, in [facility].  Thyroid Biopsy The thyroid gland is a butterfly-shaped gland situated in the front of the neck. It produces hormones which affect metabolism, growth and development, and body temperature. A thyroid biopsy is a procedure in which small samples of tissue or fluid are removed from the thyroid gland or mass and examined under a microscope. This test is done to determine the cause of thyroid problems, such as infection, cancer, or other thyroid problems. There are 2 ways to obtain samples: 1. Fine needle biopsy. Samples are removed using a thin needle inserted through the skin and into the thyroid gland or mass. 2. Open biopsy. Samples are removed after a cut (incision) is made through the skin. LET YOUR CAREGIVER KNOW ABOUT:   Allergies.  Medications taken including herbs, eye drops, over-the-counter medications, and creams.  Use of steroids (by mouth or creams).  Previous problems with anesthetics or numbing medicine.  Possibility of pregnancy, if this applies.  History of blood clots (thrombophlebitis).  History of bleeding or blood problems.  Previous surgery.  Other health problems. RISKS AND COMPLICATIONS  Bleeding from the site. The risk of bleeding is higher if you have a bleeding disorder or are taking any blood thinning medications (anticoagulants).  Infection.  Injury to structures near the thyroid gland. BEFORE THE PROCEDURE  This is a procedure that can be done as an outpatient. Confirm the time that you need to arrive for your procedure. Confirm whether there is a need to fast or withhold any medications. A blood sample may be done to determine your blood clotting time. Medicine may be given to help you relax  (sedative). PROCEDURE Fine needle biopsy. You will be awake during the procedure. You may be asked to lie on your back with your head tipped backward to extend your neck. Let your caregiver know if you cannot tolerate the positioning. An area on your neck will be cleansed. A needle is inserted through the skin of your neck. You may feel a mild discomfort during this procedure. You may be asked to avoid coughing, talking, swallowing, or making sounds during some portions of the procedure. The needle is withdrawn once tissue or fluid samples have been removed. Pressure may be applied to the neck to reduce swelling and ensure that bleeding has stopped. The samples will be sent for examination.  Open biopsy. You will be given general anesthesia. You will be asleep during the procedure. An incision is made in your neck. A sample of thyroid tissue or the mass is removed. The tissue sample or mass will be sent for examination. The sample or mass may be examined during the biopsy. If the sample or mass contains cancer cells, some or all of the thyroid gland may be removed. The incision is closed with stitches. AFTER THE PROCEDURE  Your recovery will be assessed and monitored. If there are no problems, as an outpatient, you should be able to go home shortly after the procedure. If you had a fine needle biopsy:  You may have soreness at the biopsy site for 1 to 2 days. If you had an open biopsy:   You may have soreness at the biopsy site for 3 to 4 days.  You may have a hoarse voice  or sore throat for 1 to 2 days. Obtaining the Test Results It is your responsibility to obtain your test results. Do not assume everything is normal if you have not heard from your caregiver or the medical facility. It is important for you to follow up on all of your test results. HOME CARE INSTRUCTIONS   Keeping your head raised on a pillow when you are lying down may ease biopsy site discomfort.  Supporting the back of your  head and neck with both hands as you sit up from a lying position may ease biopsy site discomfort.  Only take over-the-counter or prescription medicines for pain, discomfort, or fever as directed by your caregiver.  Throat lozenges or gargling with warm salt water may help to soothe a sore throat. SEEK IMMEDIATE MEDICAL CARE IF:   You have severe bleeding from the biopsy site.  You have difficulty swallowing.  You have a fever.  You have increased pain, swelling, redness, or warmth at the biopsy site.  You notice pus coming from the biopsy site.  You have swollen glands (lymph nodes) in your neck. Document Released: 06/15/2007 Document Revised: 12/13/2012 Document Reviewed: 11/10/2013 [facility] Patient Information 2015 [place], [place]. This information is not intended to replace advice given to you by your health care provider. Make sure you discuss any questions you have with your health care provider.

## 2014-05-30 NOTE — Progress Notes (Addendum)
Patient ID: Laura [name], female   DOB: 06-23-1956, 58 y.o.   MRN: [id number]   HPI  Laura [name] is a 58 y.o.-year-old female, referred by her PCP, Dr. Kendra Opitz [facility]), for evaluation for L thyroid nodule.  Thyroid U/S (04/27/2014): Complex L thyroid nodule in the lower part of the L lobe: 1.7 x 0.9 x 1.3 cm. There is a smaller L hypoechoic nodule 0.7 x 0.7 x 0.7 cm adjacent to the other nodule.  No records of recent TFTs available.   Pt denies feeling nodules in neck, hoarseness, dysphagia/odynophagia, SOB with lying down.  Pt c/o: - + heat intolerance (hot flushes) - especially at night - no tremors - no palpitations lately, has had them since her 30s >> has Metoprolol prn, not used recently - + anxiety/+ depression (divorce) - no hyperdefecation/no constipation - + weight loss (intentional, + [employer], + exercise) - no dry skin - no hair falling - no fatigue  Pt does have a FH of thyroid ds. in sister. No FH of thyroid cancer. No h/o radiation tx to head or neck.  No seaweed or kelp, no recent contrast studies. No steroid use - stopped Prednisone. No herbal supplements.   I reviewed her chart and she also has a history of RA.  ROS: Constitutional: see HPI, + nocturia Eyes: no blurry vision, no xerophthalmia ENT: no sore throat, no nodules palpated in throat, no dysphagia/odynophagia, no hoarseness, + decreased hearing Cardiovascular: no CP/SOB/palpitations/leg swelling Respiratory: no cough/SOB Gastrointestinal: no N/V/D/C Musculoskeletal:+ both: muscle/joint aches Skin: no rashes Neurological: no tremors/numbness/tingling/dizziness Psychiatric: no depression/anxiety + low libido  Past Medical History  Diagnosis Date  . Allergy   . Rheumatoid arthritis   . Prediabetes   . Bone spur    Past Surgical History  Procedure Laterality Date  . Cesarean section    . Wisdom tooth extraction    . Tonsillectomy    . Hernia repair    . Tumor  removal     History   Social History  . Marital Status: Married    Spouse Name: N/A    Number of Children: 1   Occupational History  . Diplomatic Services operational officer   Social History Main Topics  . Smoking status: Former Smoker, quit in 2014    Types: Cigarettes  . Smokeless tobacco: Never Used  . Alcohol Use: No  . Drug Use: No   Current Outpatient Prescriptions on File Prior to Visit  Medication Sig Dispense Refill  . aspirin 81 MG tablet Take 81 mg by mouth daily.      . folic acid (FOLVITE) 1 MG tablet Take 1 mg by mouth daily.      . hydroxychloroquine (PLAQUENIL) 200 MG tablet Take 200 mg by mouth daily.      . methotrexate (RHEUMATREX) 2.5 MG tablet Take 2.5 mg by mouth once a week. Caution:Chemotherapy. Protect from light.      Marland Kitchen guaiFENesin (MUCINEX) 600 MG 12 hr tablet Take 1,200 mg by mouth 2 (two) times daily.      Marland Kitchen HYDROcodone-acetaminophen (LORTAB 5) 5-500 MG per tablet Take 1 tablet by mouth every 4 (four) hours as needed for pain.  12 tablet  0  . ibuprofen (ADVIL,MOTRIN) 800 MG tablet Take 1 tablet (800 mg total) by mouth 3 (three) times daily.  21 tablet  0  . ibuprofen (ADVIL,MOTRIN) 800 MG tablet Take 1 tablet (800 mg total) by mouth 3 (three) times daily.  21 tablet  0  .  metoprolol succinate (TOPROL-XL) 50 MG 24 hr tablet Take 50 mg by mouth daily. Take with or immediately following a meal.      . Multiple Vitamin (MULTIVITAMIN) tablet Take 1 tablet by mouth daily.      . predniSONE (DELTASONE) 10 MG tablet Take 10 mg by mouth daily with breakfast.      . traMADol (ULTRAM) 50 MG tablet Take 1 tablet (50 mg total) by mouth every 6 (six) hours as needed for pain.  15 tablet  0   No current facility-administered medications on file prior to visit.   No Known Allergies Family History  Problem Relation Age of Onset  . Hypertension Mother    PE: BP 112/68  Pulse 65  Temp(Src) 97.4 F (36.3 C) (Oral)  Resp 12  Ht 5\' 6"  (1.676 m)  Wt 160 lb (72.576 kg)  BMI 25.84 kg/m2   SpO2 98% Wt Readings from Last 3 Encounters:  05/30/14 160 lb (72.576 kg)  07/15/13 170 lb (77.111 kg)  05/20/13 177 lb (80.287 kg)   Constitutional: normal weight, in NAD Eyes: PERRLA, EOMI, no exophthalmos ENT: moist mucous membranes, no thyromegaly, L thyroid nodule palpable, no cervical lymphadenopathy Cardiovascular: RRR, No MRG Respiratory: CTA B Gastrointestinal: abdomen soft, NT, ND, BS+ Musculoskeletal: no deformities, strength intact in all 4;  Skin: moist, warm, no rashes Neurological: no tremor with outstretched hands, DTR normal in all 4  ASSESSMENT: 1. L thyroid nodule  - cardiologist: Dr 05/22/13 - rheumatologist: Dr Lollie Marrow  PLAN: 1.  - I reviewed the images of her thyroid ultrasound (pt brought the CD) along with the patient. I pointed out that the dominant nodule is not very large (max dimension 1.7 mc), it has colloid granules, not calcifications, no internal blood flow, is more wide than tall, and well delimited from surrounding tissue. Pt does not have a thyroid cancer family history or a personal history of RxTx to head/neck. All these would favor benignity.  - the only way that we can tell exactly if it is cancer or not is by doing a thyroid biopsy (FNA). I explained what the test entails. - We discussed about other options, to wait for another 6 months to a year and see if the nodule grows, and only intervene at that time.  - I explained that this is not cancer, we can continue to follow her on a yearly basis, and check another ultrasound in another year or 2. - patient agrees with the plan, but I would like to have a set of TFTs first to see if the TSH is low >> in this case, we may need an Uptake and scan to see if the nodules is "hot" (hormone-producing) as in this case, we may not need the Bx since these nodules are almost never cancerous - she would like to have these labs drawn at the next physical with PCP, in 4 days, and then have the  results sent to me - if the TFTs are normal, will go ahead with the FNA (I explained what the test entails) - I'll see her back in a year, assuming her FNA is normal. If FNA abnormal, we will meet sooner.  - I advised pt to join my chart and I will send her the results through there    I will addend the results and further plan when they become available.  06/08/2014 Records received from PCP - TFTs normal: 06/03/2014: TSH 1.645, fT4 1.30, TT3 67.26 (60-181)  - time  spent with the patient: 1 hour, of which >50% was spent in obtaining information about her symptoms, reviewing her previous labs, evaluations, and treatments, counseling her about her condition (please see the discussed topics above), and developing a plan to further investigate it. She had a number of questions which I addressed.   FNA benign: Adequacy Reason Satisfactory For Evaluation. Diagnosis THYROID, LEFT LOWER POLE, FINE NEEDLE ASPIRATION (SPECIMEN 1 OF 1, COLLECTED ON 09/13/2014): FINDINGS CONSISTENT WITH BENIGN THYROID NODULE (BETHESDA CATEGORY II). Zandra Abts MD Pathologist, Electronic Signature (Case signed 09/14/2014) Specimen Clinical Information Complex nodule in the lower pole of the left lobe predominately solid measures 1.7 x .9 x 1.3cm Source Thyroid, Fine Needle Aspiration, Left, (Specimen 1of 1, collected on 09/13/2014)

## 2014-05-31 ENCOUNTER — Telehealth: Payer: Self-pay | Admitting: Endocrinology

## 2014-07-03 ENCOUNTER — Encounter: Payer: Self-pay | Admitting: Internal Medicine

## 2014-08-30 NOTE — Telephone Encounter (Signed)
error 

## 2014-08-31 ENCOUNTER — Other Ambulatory Visit: Payer: Self-pay | Admitting: Internal Medicine

## 2014-08-31 ENCOUNTER — Ambulatory Visit: Payer: Federal, State, Local not specified - PPO | Admitting: Internal Medicine

## 2014-08-31 DIAGNOSIS — E041 Nontoxic single thyroid nodule: Secondary | ICD-10-CM

## 2014-09-13 ENCOUNTER — Other Ambulatory Visit (HOSPITAL_COMMUNITY)
Admission: RE | Admit: 2014-09-13 | Discharge: 2014-09-13 | Disposition: A | Discharge status: Home / Self Care | Payer: BLUE CROSS/BLUE SHIELD | Source: Ambulatory Visit | Attending: Interventional Radiology | Admitting: Interventional Radiology

## 2014-09-13 ENCOUNTER — Ambulatory Visit
Admission: RE | Admit: 2014-09-13 | Discharge: 2014-09-13 | Disposition: A | Discharge status: Home / Self Care | Payer: BLUE CROSS/BLUE SHIELD | Source: Ambulatory Visit | Attending: Internal Medicine | Admitting: Internal Medicine

## 2014-09-13 DIAGNOSIS — E041 Nontoxic single thyroid nodule: Secondary | ICD-10-CM | POA: Insufficient documentation

## 2014-09-14 NOTE — Addendum Note (Signed)
Addended by: Carlus Pavlov on: 09/14/2014 05:20 PM   Modules accepted: Level of Service

## 2014-10-14 ENCOUNTER — Inpatient Hospital Stay (HOSPITAL_COMMUNITY)
Admission: EM | Admit: 2014-10-14 | Discharge: 2014-10-17 | DRG: 871 | Disposition: A | Discharge status: Home / Self Care | Payer: BLUE CROSS/BLUE SHIELD | Attending: Internal Medicine | Admitting: Internal Medicine

## 2014-10-14 ENCOUNTER — Encounter (HOSPITAL_COMMUNITY): Payer: Self-pay | Admitting: Emergency Medicine

## 2014-10-14 ENCOUNTER — Emergency Department (HOSPITAL_COMMUNITY): Payer: BLUE CROSS/BLUE SHIELD

## 2014-10-14 DIAGNOSIS — A419 Sepsis, unspecified organism: Principal | ICD-10-CM | POA: Diagnosis present

## 2014-10-14 DIAGNOSIS — Z79899 Other long term (current) drug therapy: Secondary | ICD-10-CM | POA: Diagnosis not present

## 2014-10-14 DIAGNOSIS — N170 Acute kidney failure with tubular necrosis: Secondary | ICD-10-CM | POA: Diagnosis present

## 2014-10-14 DIAGNOSIS — D72829 Elevated white blood cell count, unspecified: Secondary | ICD-10-CM | POA: Diagnosis present

## 2014-10-14 DIAGNOSIS — D638 Anemia in other chronic diseases classified elsewhere: Secondary | ICD-10-CM | POA: Diagnosis present

## 2014-10-14 DIAGNOSIS — M069 Rheumatoid arthritis, unspecified: Secondary | ICD-10-CM | POA: Diagnosis present

## 2014-10-14 DIAGNOSIS — R6521 Severe sepsis with septic shock: Secondary | ICD-10-CM | POA: Diagnosis present

## 2014-10-14 DIAGNOSIS — Z87891 Personal history of nicotine dependence: Secondary | ICD-10-CM | POA: Diagnosis not present

## 2014-10-14 DIAGNOSIS — N390 Urinary tract infection, site not specified: Secondary | ICD-10-CM | POA: Diagnosis present

## 2014-10-14 DIAGNOSIS — N179 Acute kidney failure, unspecified: Secondary | ICD-10-CM | POA: Diagnosis present

## 2014-10-14 DIAGNOSIS — D649 Anemia, unspecified: Secondary | ICD-10-CM | POA: Diagnosis present

## 2014-10-14 DIAGNOSIS — I959 Hypotension, unspecified: Secondary | ICD-10-CM | POA: Diagnosis present

## 2014-10-14 LAB — COMPREHENSIVE METABOLIC PANEL
ALK PHOS: 58 U/L (ref 39–117)
ALT: 27 U/L (ref 0–35)
AST: 89 U/L — ABNORMAL HIGH (ref 0–37)
Albumin: 4.4 g/dL (ref 3.5–5.2)
Anion gap: 13 (ref 5–15)
BUN: 24 mg/dL — AB (ref 6–23)
CO2: 23 mmol/L (ref 19–32)
Calcium: 9.6 mg/dL (ref 8.4–10.5)
Chloride: 101 mmol/L (ref 96–112)
Creatinine, Ser: 1.54 mg/dL — ABNORMAL HIGH (ref 0.50–1.10)
GFR calc non Af Amer: 36 mL/min — ABNORMAL LOW (ref >90)
GFR, EST AFRICAN AMERICAN: 42 mL/min — AB (ref >90)
Glucose, Bld: 106 mg/dL — ABNORMAL HIGH (ref 70–99)
Potassium: 4 mmol/L (ref 3.5–5.1)
SODIUM: 137 mmol/L (ref 135–145)
Total Bilirubin: 1.1 mg/dL (ref 0.3–1.2)
Total Protein: 7.4 g/dL (ref 6.0–8.3)

## 2014-10-14 LAB — CBC WITH DIFFERENTIAL/PLATELET
BASOS PCT: 0 % (ref 0–1)
Basophils Absolute: 0 10*3/uL (ref 0.0–0.1)
EOS ABS: 0 10*3/uL (ref 0.0–0.7)
EOS PCT: 0 % (ref 0–5)
HEMATOCRIT: 31.3 % — AB (ref 36.0–46.0)
HEMOGLOBIN: 10.7 g/dL — AB (ref 12.0–15.0)
LYMPHS ABS: 3 10*3/uL (ref 0.7–4.0)
LYMPHS PCT: 9 % — AB (ref 12–46)
MCH: 28.6 pg (ref 26.0–34.0)
MCHC: 34.2 g/dL (ref 30.0–36.0)
MCV: 83.7 fL (ref 78.0–100.0)
Monocytes Absolute: 3 10*3/uL — ABNORMAL HIGH (ref 0.1–1.0)
Monocytes Relative: 9 % (ref 3–12)
Neutro Abs: 26.9 10*3/uL — ABNORMAL HIGH (ref 1.7–7.7)
Neutrophils Relative %: 82 % — ABNORMAL HIGH (ref 43–77)
PLATELETS: 343 10*3/uL (ref 150–400)
RBC: 3.74 MIL/uL — AB (ref 3.87–5.11)
RDW: 16.9 % — ABNORMAL HIGH (ref 11.5–15.5)
WBC: 32.9 10*3/uL — ABNORMAL HIGH (ref 4.0–10.5)

## 2014-10-14 LAB — URINALYSIS, ROUTINE W REFLEX MICROSCOPIC
Bilirubin Urine: NEGATIVE
Glucose, UA: NEGATIVE mg/dL
Ketones, ur: NEGATIVE mg/dL
Nitrite: NEGATIVE
Protein, ur: 100 mg/dL — AB
Specific Gravity, Urine: 1.019 (ref 1.005–1.030)
Urobilinogen, UA: 1 mg/dL (ref 0.0–1.0)
pH: 5 (ref 5.0–8.0)

## 2014-10-14 LAB — URINE MICROSCOPIC-ADD ON

## 2014-10-14 LAB — I-STAT CG4 LACTIC ACID, ED: LACTIC ACID, VENOUS: 0.98 mmol/L (ref 0.5–2.0)

## 2014-10-14 MED ORDER — VANCOMYCIN HCL IN DEXTROSE 1-5 GM/200ML-% IV SOLN
1000.0000 mg | INTRAVENOUS | Status: DC
  Filled 2014-10-14: qty 200

## 2014-10-14 MED ORDER — VANCOMYCIN HCL 10 G IV SOLR
1250.0000 mg | Freq: Once | INTRAVENOUS | Status: AC
  Administered 2014-10-14: 1250 mg via INTRAVENOUS
  Filled 2014-10-14: qty 1250

## 2014-10-14 MED ORDER — PIPERACILLIN-TAZOBACTAM 3.375 G IVPB 30 MIN
3.3750 g | Freq: Once | INTRAVENOUS | Status: AC
  Administered 2014-10-14: 3.375 g via INTRAVENOUS
  Filled 2014-10-14: qty 50

## 2014-10-14 NOTE — Progress Notes (Addendum)
ANTIBIOTIC CONSULT NOTE - INITIAL  Pharmacy Consult for vancomycin Indication: rule out sepsis  No Known Allergies  Patient Measurements: Height: 5\' 7"  (170.2 cm) Weight: 155 lb (70.308 kg) IBW/kg (Calculated) : 61.6 Adjusted Body Weight:   Vital Signs: Temp: 99.3 F (37.4 C) (02/13 1917) Temp Source: Rectal (02/13 1917) BP: 95/54 mmHg (02/13 1900) Pulse Rate: 70 (02/13 1900) Intake/Output from previous day:   Intake/Output from this shift:    Labs: No results for input(s): WBC, HGB, PLT, LABCREA, CREATININE in the last 72 hours. CrCl cannot be calculated (Patient has no serum creatinine result on file.). No results for input(s): VANCOTROUGH, VANCOPEAK, VANCORANDOM, GENTTROUGH, GENTPEAK, GENTRANDOM, TOBRATROUGH, TOBRAPEAK, TOBRARND, AMIKACINPEAK, AMIKACINTROU, AMIKACIN in the last 72 hours.   Microbiology: No results found for this or any previous visit (from the past 720 hour(s)).  Medical History: Past Medical History  Diagnosis Date  . Allergy   . Rheumatoid arthritis   . Prediabetes   . Bone spur     Medications:   (Not in a hospital admission)   Assessment: 59 yo female concern for sepsis. No labs at this time.    Goal of Therapy:  Vancomycin trough level 15-20 mcg/ml  Plan:  Vancomycin 1250 mg IV x1 F/u reorder zosyn F/u BMP for vanc re-dosing     45, PharmD, BCPS Clinical Pharmacist Pager: [phone] 10/14/2014 7:20 PM      Addendum BMP returned, Scr 1.54, eCrCl 3-40 ml/min Will continue with vancomycin 1 g IV q24h   10/16/2014 8:59 PM 10/14/2014

## 2014-10-14 NOTE — ED Notes (Signed)
Pt addemently refusing in and out cath, given water PO per Dr. Rosalia Hammers

## 2014-10-14 NOTE — ED Notes (Signed)
Level 2 Sepsis activated @ 1841

## 2014-10-14 NOTE — ED Notes (Signed)
Pt sent from Dr office [facility] for further eval of hypotension and elevated WBC count. Pt c/o nausea. Pt with holter monitor on. Pt denies chest pain.

## 2014-10-15 DIAGNOSIS — A419 Sepsis, unspecified organism: Principal | ICD-10-CM

## 2014-10-15 DIAGNOSIS — N179 Acute kidney failure, unspecified: Secondary | ICD-10-CM | POA: Diagnosis present

## 2014-10-15 DIAGNOSIS — N390 Urinary tract infection, site not specified: Secondary | ICD-10-CM | POA: Diagnosis present

## 2014-10-15 DIAGNOSIS — M069 Rheumatoid arthritis, unspecified: Secondary | ICD-10-CM | POA: Diagnosis present

## 2014-10-15 DIAGNOSIS — I959 Hypotension, unspecified: Secondary | ICD-10-CM | POA: Diagnosis present

## 2014-10-15 DIAGNOSIS — D649 Anemia, unspecified: Secondary | ICD-10-CM | POA: Diagnosis present

## 2014-10-15 LAB — IRON AND TIBC
IRON: 42 ug/dL (ref 42–145)
Saturation Ratios: 19 % — ABNORMAL LOW (ref 20–55)
TIBC: 222 ug/dL — AB (ref 250–470)
UIBC: 180 ug/dL (ref 125–400)

## 2014-10-15 LAB — CBC
HEMATOCRIT: 25.6 % — AB (ref 36.0–46.0)
Hemoglobin: 8.6 g/dL — ABNORMAL LOW (ref 12.0–15.0)
MCH: 27.9 pg (ref 26.0–34.0)
MCHC: 33.6 g/dL (ref 30.0–36.0)
MCV: 83.1 fL (ref 78.0–100.0)
Platelets: 274 10*3/uL (ref 150–400)
RBC: 3.08 MIL/uL — AB (ref 3.87–5.11)
RDW: 16.7 % — AB (ref 11.5–15.5)
WBC: 23.8 10*3/uL — ABNORMAL HIGH (ref 4.0–10.5)

## 2014-10-15 LAB — BASIC METABOLIC PANEL
Anion gap: 5 (ref 5–15)
BUN: 22 mg/dL (ref 6–23)
CO2: 26 mmol/L (ref 19–32)
Calcium: 8.6 mg/dL (ref 8.4–10.5)
Chloride: 109 mmol/L (ref 96–112)
Creatinine, Ser: 0.94 mg/dL (ref 0.50–1.10)
GFR calc non Af Amer: 65 mL/min — ABNORMAL LOW (ref >90)
GFR, EST AFRICAN AMERICAN: 75 mL/min — AB (ref >90)
Glucose, Bld: 110 mg/dL — ABNORMAL HIGH (ref 70–99)
Potassium: 3.9 mmol/L (ref 3.5–5.1)
SODIUM: 140 mmol/L (ref 135–145)

## 2014-10-15 LAB — VITAMIN B12: VITAMIN B 12: 1162 pg/mL — AB (ref 211–911)

## 2014-10-15 LAB — FERRITIN: Ferritin: 749 ng/mL — ABNORMAL HIGH (ref 10–291)

## 2014-10-15 LAB — RETICULOCYTES
RBC.: 3.08 MIL/uL — ABNORMAL LOW (ref 3.87–5.11)
Retic Count, Absolute: 52.4 10*3/uL (ref 19.0–186.0)
Retic Ct Pct: 1.7 % (ref 0.4–3.1)

## 2014-10-15 LAB — FOLATE: Folate: >20 ng/mL

## 2014-10-15 LAB — MRSA PCR SCREENING: MRSA by PCR: NEGATIVE

## 2014-10-15 MED ORDER — ALUM & MAG HYDROXIDE-SIMETH 200-200-20 MG/5ML PO SUSP: 30.0000 mL | Freq: Four times a day (QID) | ORAL | Status: DC | PRN

## 2014-10-15 MED ORDER — HYDROMORPHONE HCL 1 MG/ML IJ SOLN: 0.5000 mg | INTRAMUSCULAR | Status: DC | PRN

## 2014-10-15 MED ORDER — SODIUM CHLORIDE 0.9 % IV SOLN
INTRAVENOUS | Status: AC
  Administered 2014-10-15: 04:00:00 via INTRAVENOUS

## 2014-10-15 MED ORDER — SODIUM CHLORIDE 0.9 % IV SOLN
INTRAVENOUS | Status: AC
  Administered 2014-10-15: 23:00:00 via INTRAVENOUS

## 2014-10-15 MED ORDER — ACETAMINOPHEN 325 MG PO TABS
650.0000 mg | ORAL_TABLET | Freq: Four times a day (QID) | ORAL | Status: DC | PRN
Start: 2014-10-15 — End: 2014-10-17
  Administered 2014-10-15: 650 mg via ORAL
  Filled 2014-10-15: qty 2

## 2014-10-15 MED ORDER — VANCOMYCIN HCL IN DEXTROSE 750-5 MG/150ML-% IV SOLN
750.0000 mg | Freq: Two times a day (BID) | INTRAVENOUS | Status: DC
  Administered 2014-10-15 – 2014-10-17 (×5): 750 mg via INTRAVENOUS
  Filled 2014-10-15 (×7): qty 150

## 2014-10-15 MED ORDER — OXYCODONE HCL 5 MG PO TABS
5.0000 mg | ORAL_TABLET | ORAL | Status: DC | PRN
  Administered 2014-10-15 (×2): 5 mg via ORAL
  Filled 2014-10-15 (×2): qty 1

## 2014-10-15 MED ORDER — GUAIFENESIN ER 600 MG PO TB12
600.0000 mg | ORAL_TABLET | Freq: Two times a day (BID) | ORAL | Status: DC
  Administered 2014-10-15: 600 mg via ORAL
  Filled 2014-10-15: qty 1

## 2014-10-15 MED ORDER — FOLIC ACID 1 MG PO TABS
1.0000 mg | ORAL_TABLET | Freq: Every day | ORAL | Status: DC
  Administered 2014-10-15 – 2014-10-17 (×3): 1 mg via ORAL
  Filled 2014-10-15 (×3): qty 1

## 2014-10-15 MED ORDER — ONDANSETRON HCL 4 MG PO TABS: 4.0000 mg | ORAL_TABLET | Freq: Four times a day (QID) | ORAL | Status: DC | PRN

## 2014-10-15 MED ORDER — ACETAMINOPHEN 650 MG RE SUPP: 650.0000 mg | Freq: Four times a day (QID) | RECTAL | Status: DC | PRN

## 2014-10-15 MED ORDER — HYDROXYCHLOROQUINE SULFATE 200 MG PO TABS
200.0000 mg | ORAL_TABLET | Freq: Every day | ORAL | Status: DC
  Filled 2014-10-15: qty 1

## 2014-10-15 MED ORDER — PIPERACILLIN-TAZOBACTAM 3.375 G IVPB
3.3750 g | Freq: Three times a day (TID) | INTRAVENOUS | Status: DC
  Administered 2014-10-15 – 2014-10-17 (×7): 3.375 g via INTRAVENOUS
  Filled 2014-10-15 (×11): qty 50

## 2014-10-15 MED ORDER — ONDANSETRON HCL 4 MG/2ML IJ SOLN
4.0000 mg | Freq: Four times a day (QID) | INTRAMUSCULAR | Status: DC | PRN
  Administered 2014-10-15: 4 mg via INTRAVENOUS
  Filled 2014-10-15: qty 2

## 2014-10-15 MED ORDER — ENOXAPARIN SODIUM 40 MG/0.4ML ~~LOC~~ SOLN
40.0000 mg | SUBCUTANEOUS | Status: DC
  Administered 2014-10-15 – 2014-10-17 (×3): 40 mg via SUBCUTANEOUS
  Filled 2014-10-15 (×3): qty 0.4

## 2014-10-15 MED ORDER — OXYMETAZOLINE HCL 0.05 % NA SOLN
1.0000 | Freq: Two times a day (BID) | NASAL | Status: DC
  Filled 2014-10-15: qty 15

## 2014-10-15 NOTE — H&P (Addendum)
[facility] Admission History and Physical       Laura [name] HYI:[id number] DOB: 1956/03/31 DOA: 10/14/2014  Referring physician: EDP PCP: Valinda Hoar, PA-C  Specialists:   Chief Complaint: Elevated WBCs  HPI: Laura [name] is a 59 y.o. female with a history of Rheumatoid Arthritis who presents to the  ED from her PCP due to an increasing WBC on labs.  Two days ago her WBC count was 26K  And today on re-check the count was 36 K and patient has had nausea and  cough for the past 2 days.   She reports having nausea and nonproductive coughing for the past 2 days and in the ED she was found to have hypotension and a Sepsis workup was initiated.  She was also found to have a UTI.  She was placed on IV Vancomycin and Zosyn and given IVFs for fluid resuscitation and referred for admission.      Of note for the last 2 weeks she has been undergoing a cardiac workup, and had a Holter monitor.           Review of Systems:  Constitutional: No Weight Loss, No Weight Gain, Night Sweats, Fevers, Chills, Dizziness, Light Headedness, Fatigue, or Generalized Weakness HEENT: No Headaches, Difficulty Swallowing,Tooth/Dental Problems,Sore Throat,  No Sneezing, Rhinitis, Ear Ache, Nasal Congestion, or Post Nasal Drip,  Cardio-vascular:  No Chest pain, Orthopnea, PND, Edema in Lower Extremities, Anasarca, Dizziness, Palpitations  Resp: No Dyspnea, No DOE, No Productive Cough, +Non-Productive Cough, No Hemoptysis, No Wheezing.    GI: No Heartburn, Indigestion, Abdominal Pain, +Nausea, Vomiting, Diarrhea, Constipation, Hematemesis, Hematochezia, Melena, Change in Bowel Habits,  Loss of Appetite  GU: No Dysuria, No Change in Color of Urine, No Urgency or Urinary Frequency, No Flank pain.  Musculoskeletal: No Joint Pain or Swelling, No Decreased Range of Motion, No Back Pain.  Neurologic: No Syncope, No Seizures, Muscle Weakness, Paresthesia, Vision Disturbance or Loss, No Diplopia, No Vertigo,  No Difficulty Walking,  Skin: No Rash or Lesions. Psych: No Change in Mood or Affect, No Depression or Anxiety, No Memory loss, No Confusion, or Hallucinations   Past Medical History  Diagnosis Date  . Allergy   . Rheumatoid arthritis   . Prediabetes   . Bone spur      Past Surgical History  Procedure Laterality Date  . Cesarean section    . Wisdom tooth extraction    . Tonsillectomy    . Hernia repair    . Tumor removal        Prior to Admission medications   Medication Sig Start Date End Date Taking? Authorizing Provider  folic acid (FOLVITE) 1 MG tablet Take 1 mg by mouth daily.   Yes Historical Provider, MD  hydroxychloroquine (PLAQUENIL) 200 MG tablet Take 200 mg by mouth daily.   Yes Historical Provider, MD  isosorbide mononitrate (IMDUR) 30 MG 24 hr tablet Take 30 mg by mouth daily. 10/13/14  Yes Historical Provider, MD  metoprolol succinate (TOPROL-XL) 50 MG 24 hr tablet Take 50 mg by mouth daily. Take with or immediately following a meal.   Yes Historical Provider, MD  promethazine (PHENERGAN) 12.5 MG tablet Take 12.5 mg by mouth every 6 (six) hours as needed. 10/13/14  Yes Historical Provider, MD  HYDROcodone-acetaminophen (LORTAB 5) 5-500 MG per tablet Take 1 tablet by mouth every 4 (four) hours as needed for pain. Patient not taking: Reported on 10/14/2014 07/15/13   Melvenia Beam A Upstill, PA-C  ibuprofen (ADVIL,MOTRIN) 800 MG  tablet Take 1 tablet (800 mg total) by mouth 3 (three) times daily. Patient not taking: Reported on 10/14/2014 05/21/13   April K Palumbo-Rasch, MD  ibuprofen (ADVIL,MOTRIN) 800 MG tablet Take 1 tablet (800 mg total) by mouth 3 (three) times daily. Patient not taking: Reported on 10/14/2014 07/15/13   Melvenia Beam A Upstill, PA-C  traMADol (ULTRAM) 50 MG tablet Take 1 tablet (50 mg total) by mouth every 6 (six) hours as needed for pain. Patient not taking: Reported on 10/14/2014 05/21/13   April K Palumbo-Rasch, MD     No Known Allergies  Social History:   reports that she has quit smoking. Her smoking use included Cigarettes. She has never used smokeless tobacco. She reports that she does not drink alcohol or use illicit drugs.    Family History  Problem Relation Age of Onset  . Hypertension Mother        Physical Exam:  GEN:  Pleasant Well Nourished and Well Developed 59 y.o. African American female examined and in no acute distress; cooperative with exam Filed Vitals:   10/14/14 2315 10/14/14 2330 10/14/14 2345 10/15/14 0000  BP: 89/57 89/54 88/69  91/56  Pulse: 65 72 68 69  Temp:      TempSrc:      Resp: 21 21 19 18   Height:      Weight:      SpO2: 97% 96% 98% 96%   Blood pressure 91/56, pulse 69, temperature 99.3 F (37.4 C), temperature source Rectal, resp. rate 18, height 5\' 7"  (1.702 m), weight 70.308 kg (155 lb), SpO2 96 %. PSYCH: She is alert and oriented x4; does not appear anxious does not appear depressed; affect is normal HEENT: Normocephalic and Atraumatic, Mucous membranes pink; PERRLA; EOM intact; Fundi:  Benign;  No scleral icterus, Nares: Patent, Oropharynx: Clear, Fair Dentition,    Neck:  FROM, No Cervical Lymphadenopathy nor Thyromegaly or Carotid Bruit; No JVD; Breasts:: Not examined CHEST WALL: No tenderness CHEST: Normal respiration, clear to auscultation bilaterally HEART: Regular rate and rhythm; no murmurs rubs or gallops BACK: No kyphosis or scoliosis; No CVA tenderness ABDOMEN: Positive Bowel Sounds, Scaphoid, Obese, Soft Non-Tender, No Rebound or Guarding; No Masses, No Organomegaly. Rectal Exam: Not done EXTREMITIES: No Cyanosis, Clubbing, or Edema; No Ulcerations. Genitalia: not examined PULSES: 2+ and symmetric SKIN: Normal hydration no rash or ulceration CNS:  Alert and Oriented x 4, No Focal Deficits Vascular: pulses palpable throughout    Labs on Admission:  Basic Metabolic Panel:  Recent Labs Lab 10/14/14 1835  NA 137  K 4.0  CL 101  CO2 23  GLUCOSE 106*  BUN 24*  CREATININE  1.54*  CALCIUM 9.6   Liver Function Tests:  Recent Labs Lab 10/14/14 1835  AST 89*  ALT 27  ALKPHOS 58  BILITOT 1.1  PROT 7.4  ALBUMIN 4.4   No results for input(s): LIPASE, AMYLASE in the last 168 hours. No results for input(s): AMMONIA in the last 168 hours. CBC:  Recent Labs Lab 10/14/14 1835  WBC 32.9*  NEUTROABS 26.9*  HGB 10.7*  HCT 31.3*  MCV 83.7  PLT 343   Cardiac Enzymes: No results for input(s): CKTOTAL, CKMB, CKMBINDEX, TROPONINI in the last 168 hours.  BNP (last 3 results) No results for input(s): BNP in the last 8760 hours.  ProBNP (last 3 results) No results for input(s): PROBNP in the last 8760 hours.  CBG: No results for input(s): GLUCAP in the last 168 hours.  Radiological Exams on Admission: Dg Chest  Port 1 View  (if Code Sepsis Called)  10/14/2014   CLINICAL DATA:  Fatigue and difficulty breathing  EXAM: PORTABLE CHEST - 1 VIEW  COMPARISON:  December 30, 2006  FINDINGS: Lungs are clear. Heart is upper normal in size with pulmonary vascularity within normal limits. No adenopathy. No bone lesions.  IMPRESSION: No edema or consolidation.   Electronically Signed   By: Bretta Bang III M.D.   On: 10/14/2014 19:29     EKG: Independently reviewed.    Assessment/Plan:   59 y.o. female with  Principal Problem:   1.    Sepsis/ Septic Shock/ Leukocytosis   Cultures sent   IV Vancomycin and Zosyn   IVFs   Active Problems:   2.   UTI (lower urinary tract infection)   Urine C+S sent   Abxs above in #1     3.   Hypotension- due to #1     IVFs   Monitor BPs   Hold Metoprolol, Imdur and      4.   AKI (acute kidney injury)   IVFs   Hold NSAIDs   Monitor BUN/Cr     5.   Anemia   Send Anemia Panel   Monitor Trend     6.   Rheumatoid arthritis- Patient reports being on Methotrexate Rx?    Verify Meds   Only Plaquenil is listed     7.   DVT Prophylaxis    Lovenox      Code Status:   FULL CODE Family Communication:  No  Family Present  Disposition Plan:     Inpatient  Time spent:  5 Minutes  Ron Parker [facility] Pager [phone]   If 7AM -7PM Please Contact the Day Rounding Team MD for [facility]  If 7PM-7AM, Please Contact Night-Floor Coverage  [email or web address] Password [email or web address] 10/15/2014, 12:10 AM     ADDENDUM:   Patient was seen and examined on 10/15/2014

## 2014-10-15 NOTE — Progress Notes (Signed)
Utilization Review Completed.Laura [name] T2/14/2016  

## 2014-10-15 NOTE — ED Provider Notes (Signed)
CSN: [id number]     Arrival date & time 10/14/14  1803 History   First MD Initiated Contact with Patient 10/14/14 1820     Chief Complaint  Patient presents with  . Hypotension  . Abnormal Lab     (Consider location/radiation/quality/duration/timing/severity/associated sxs/prior Treatment) HPI Patient presents to the emergency department with an elevated white blood cell count and hypotension from her doctor's office.  The patient was complaining of nausea.  She was seen by them yesterday and noted to have a white count 26,000.  Today they noted her white blood cell come to be 36,000.  She states that she has had some cough along with the nausea.  The patient denies chest pain, shortness of breath, abdominal pain, dysuria, weakness, dizziness, back pain, diarrhea, vomiting, or mental status, or syncope.  The patient states nothing seems make her condition better or worse Past Medical History  Diagnosis Date  . Allergy   . Rheumatoid arthritis   . Prediabetes   . Bone spur    Past Surgical History  Procedure Laterality Date  . Cesarean section    . Wisdom tooth extraction    . Tonsillectomy    . Hernia repair    . Tumor removal     Family History  Problem Relation Age of Onset  . Hypertension Mother    History  Substance Use Topics  . Smoking status: Former Smoker    Types: Cigarettes  . Smokeless tobacco: Never Used  . Alcohol Use: No   OB History    Gravida Para Term Preterm AB TAB SAB Ectopic Multiple Living   4 1 1       1      Review of Systems All other systems negative except as documented in the HPI. All pertinent positives and negatives as reviewed in the HPI.   Allergies  Review of patient's allergies indicates no known allergies.  Home Medications   Prior to Admission medications   Medication Sig Start Date End Date Taking? Authorizing Provider  folic acid (FOLVITE) 1 MG tablet Take 1 mg by mouth daily.   Yes Historical Provider, MD  hydroxychloroquine  (PLAQUENIL) 200 MG tablet Take 200 mg by mouth daily.   Yes Historical Provider, MD  isosorbide mononitrate (IMDUR) 30 MG 24 hr tablet Take 30 mg by mouth daily. 10/13/14  Yes Historical Provider, MD  metoprolol succinate (TOPROL-XL) 50 MG 24 hr tablet Take 50 mg by mouth daily. Take with or immediately following a meal.   Yes Historical Provider, MD  promethazine (PHENERGAN) 12.5 MG tablet Take 12.5 mg by mouth every 6 (six) hours as needed. 10/13/14  Yes Historical Provider, MD  HYDROcodone-acetaminophen (LORTAB 5) 5-500 MG per tablet Take 1 tablet by mouth every 4 (four) hours as needed for pain. Patient not taking: Reported on 10/14/2014 07/15/13   07/17/13 A Upstill, PA-C  ibuprofen (ADVIL,MOTRIN) 800 MG tablet Take 1 tablet (800 mg total) by mouth 3 (three) times daily. Patient not taking: Reported on 10/14/2014 05/21/13   April K Palumbo-Rasch, MD  ibuprofen (ADVIL,MOTRIN) 800 MG tablet Take 1 tablet (800 mg total) by mouth 3 (three) times daily. Patient not taking: Reported on 10/14/2014 07/15/13   07/17/13 A Upstill, PA-C  traMADol (ULTRAM) 50 MG tablet Take 1 tablet (50 mg total) by mouth every 6 (six) hours as needed for pain. Patient not taking: Reported on 10/14/2014 05/21/13   April K Palumbo-Rasch, MD   BP 91/56 mmHg  Pulse 69  Temp(Src) 99.3 F (37.4  C) (Rectal)  Resp 18  Ht 5\' 7"  (1.702 m)  Wt 155 lb (70.308 kg)  BMI 24.27 kg/m2  SpO2 96% Physical Exam  Constitutional: She is oriented to person, place, and time. She appears well-developed and well-nourished. No distress.  HENT:  Head: Normocephalic and atraumatic.  Mouth/Throat: Oropharynx is clear and moist.  Eyes: Pupils are equal, round, and reactive to light.  Neck: Normal range of motion. Neck supple.  Cardiovascular: Normal rate, regular rhythm and normal heart sounds.  Exam reveals no gallop and no friction rub.   No murmur heard. Pulmonary/Chest: Effort normal and breath sounds normal. No respiratory distress.  Abdominal:  Soft. Bowel sounds are normal. She exhibits no distension. There is no tenderness. There is no rebound and no guarding.  Neurological: She is alert and oriented to person, place, and time. She exhibits normal muscle tone. Coordination normal.  Skin: Skin is warm and dry. No rash noted. No erythema.  Psychiatric: She has a normal mood and affect. Her behavior is normal.  Nursing note and vitals reviewed.   ED Course  Procedures (including critical care time) Labs Review Labs Reviewed  CBC WITH DIFFERENTIAL/PLATELET - Abnormal; Notable for the following:    WBC 32.9 (*)    RBC 3.74 (*)    Hemoglobin 10.7 (*)    HCT 31.3 (*)    RDW 16.9 (*)    Neutrophils Relative % 82 (*)    Lymphocytes Relative 9 (*)    Neutro Abs 26.9 (*)    Monocytes Absolute 3.0 (*)    All other components within normal limits  COMPREHENSIVE METABOLIC PANEL - Abnormal; Notable for the following:    Glucose, Bld 106 (*)    BUN 24 (*)    Creatinine, Ser 1.54 (*)    AST 89 (*)    GFR calc non Af Amer 36 (*)    GFR calc Af Amer 42 (*)    All other components within normal limits  URINALYSIS, ROUTINE W REFLEX MICROSCOPIC - Abnormal; Notable for the following:    APPearance CLOUDY (*)    Hgb urine dipstick TRACE (*)    Protein, ur 100 (*)    Leukocytes, UA LARGE (*)    All other components within normal limits  URINE MICROSCOPIC-ADD ON - Abnormal; Notable for the following:    Bacteria, UA MANY (*)    Crystals CA OXALATE CRYSTALS (*)    All other components within normal limits  URINE CULTURE  CULTURE, BLOOD (ROUTINE X 2)  CULTURE, BLOOD (ROUTINE X 2)  I-STAT CG4 LACTIC ACID, ED    Imaging Review Dg Chest Port 1 View  (if Code Sepsis Called)  10/14/2014   CLINICAL DATA:  Fatigue and difficulty breathing  EXAM: PORTABLE CHEST - 1 VIEW  COMPARISON:  December 30, 2006  FINDINGS: Lungs are clear. Heart is upper normal in size with pulmonary vascularity within normal limits. No adenopathy. No bone lesions.   IMPRESSION: No edema or consolidation.   Electronically Signed   By: Jan 01, 2007 III M.D.   On: 10/14/2014 19:29     MDM   Final diagnoses:  Sepsis, due to unspecified organism    CRITICAL CARE Performed by: 10/16/2014 Total critical care time: 45 minutes Critical care time was exclusive of separately billable procedures and treating other patients. Critical care was necessary to treat or prevent imminent or life-threatening deterioration. Critical care was time spent personally by me on the following activities: development of treatment plan with patient  and/or surrogate as well as nursing, discussions with consultants, evaluation of patient's response to treatment, examination of patient, obtaining history from patient or surrogate, ordering and performing treatments and interventions, ordering and review of laboratory studies, ordering and review of radiographic studies, pulse oximetry and re-evaluation of patient's condition.  Patient initially had a blood pressure of 70/40.  The second reading was 80/47.  She received multiple fluid boluses and IV antibiotics consisted of his vancomycin and Zosyn.  Patient otherwise had mentating appropriately.  Her main complaint was nausea with some cough.  A level II code sepsis.  I spoke with the [facility] who will admit the patient to the stepdown unit   Carlyle Dolly, PA-C 10/15/14 0032  Hilario Quarry, MD 10/25/14 [phone]

## 2014-10-15 NOTE — Progress Notes (Signed)
[facility] PROGRESS NOTE  Laura [name] GLO:[id number] DOB: 05-16-1956 DOA: 10/14/2014 PCP: Valinda Hoar, PA-C  Assessment/Plan: 1. Sepsis/ Septic Shock/ Leukocytosis ==Cultures pending == cont IV Vancomycin and Zosyn ==Deescalte abx once cx data available ==IVFs. ==WBC trending down 36->23   Active Problems:  2. UTI (lower urinary tract infection) ==Urine C+S sent ==Abxs above in #1    3. Hypotension- due to #1 ==cont IVFs ==Hold Metoprolol, Imdur      4. AKI (acute kidney injury) ==2/2 ATN and NSAIDs ==cont with IVFs ==Hold NSAIDs == BUN/Cr normalised    5. Anemia == Anemia Panel pend ==Hb 10.7->8.6 ==dilutional ==low retic count with nl MCV.possible underlying iron def or AOCD    6. Rheumatoid arthritis- Patient reports being on Methotrexate Rx?  == hold Plaquenil 2/2 infection    7. DVT Prophylaxis  ==Lovenox  Code Status: Full Family Communication: daughter and husband Disposition Plan: pending clinical improvement and cx data   Consultants:  none  Procedures:  CXR No edema or consolidation.  Antibiotics:  vanc and Zosyn 2/13 HPI/Subjective: Laura [name] is a 59 y.o. female with a history of Rheumatoid Arthritis who presents to the ED from her PCP due to an increasing WBC on labs. Two days ago her WBC count was 26K And today on re-check the count was 36 K and patient has had nausea and cough for the past 2 days. She reports having nausea and nonproductive coughing for the past 2 days and in the ED she was found to have hypotension and a Sepsis workup was initiated. She was also found to have a UTI. She was placed on IV Vancomycin and Zosyn and given IVFs for fluid resuscitation and referred for admission.  Feel better today.  Objective: Filed Vitals:   10/15/14 1442  BP:   Pulse:   Temp: 99.1 F (37.3 C)  Resp:     Intake/Output Summary (Last 24  hours) at 10/15/14 1535 Last data filed at 10/15/14 1120  Gross per 24 hour  Intake 1395.41 ml  Output    900 ml  Net 495.41 ml   Filed Weights   10/14/14 1813 10/15/14 0545  Weight: 70.308 kg (155 lb) 73.6 kg (162 lb 4.1 oz)    Exam:  HEENT: Normocephalic and Atraumatic, Mucous membranes pink;  PERRLA; EOM intact; Fundi: Benign; No scleral icterus, Nares: Patent, Oropharynx: Clear, Fair Dentition,  Neck: FROM, No Cervical Lymphadenopathy nor Thyromegaly or Carotid Bruit; No JVD; CHEST WALL: No tenderness CHEST: Normal respiration, clear to auscultation bilaterally HEART: Regular rate and rhythm; no murmurs rubs or gallops ABDOMEN: Positive Bowel Sounds, Scaphoid, Obese, Soft Non-Tender, No Rebound or Guarding; No Masses, No Organomegaly. Rectal Exam: Not done EXTREMITIES: No Cyanosis, Clubbing, or Edema; No Ulcerations. PULSES: 2+ and symmetric SKIN: Normal hydration no rash or ulceration CNS: Alert and Oriented x 4, No Focal Deficits Vascular: pulses palpable throughout Data Reviewed: Basic Metabolic Panel:  Recent Labs Lab 10/14/14 1835 10/15/14 0305  NA 137 140  K 4.0 3.9  CL 101 109  CO2 23 26  GLUCOSE 106* 110*  BUN 24* 22  CREATININE 1.54* 0.94  CALCIUM 9.6 8.6   Liver Function Tests:  Recent Labs Lab 10/14/14 1835  AST 89*  ALT 27  ALKPHOS 58  BILITOT 1.1  PROT 7.4  ALBUMIN 4.4   No results for input(s): LIPASE, AMYLASE in the last 168 hours. No results for input(s): AMMONIA in the last 168 hours. CBC:  Recent Labs Lab 10/14/14 1835 10/15/14  0305  WBC 32.9* 23.8*  NEUTROABS 26.9*  --   HGB 10.7* 8.6*  HCT 31.3* 25.6*  MCV 83.7 83.1  PLT 343 274   Cardiac Enzymes: No results for input(s): CKTOTAL, CKMB, CKMBINDEX, TROPONINI in the last 168 hours. BNP (last 3 results) No results for input(s): BNP in the last 8760 hours.  ProBNP (last 3 results) No results for input(s): PROBNP in the last 8760  hours.  CBG: No results for input(s): GLUCAP in the last 168 hours.  Recent Results (from the past 240 hour(s))  MRSA PCR Screening     Status: None   Collection Time: 10/15/14  5:58 AM  Result Value Ref Range Status   MRSA by PCR NEGATIVE NEGATIVE Final    Comment:        The GeneXpert MRSA Assay (FDA approved for NASAL specimens only), is one component of a comprehensive MRSA colonization surveillance program. It is not intended to diagnose MRSA infection nor to guide or monitor treatment for MRSA infections.      Studies: Dg Chest Port 1 View  (if Code Sepsis Called)  10/14/2014   CLINICAL DATA:  Fatigue and difficulty breathing  EXAM: PORTABLE CHEST - 1 VIEW  COMPARISON:  December 30, 2006  FINDINGS: Lungs are clear. Heart is upper normal in size with pulmonary vascularity within normal limits. No adenopathy. No bone lesions.  IMPRESSION: No edema or consolidation.   Electronically Signed   By: Bretta Bang III M.D.   On: 10/14/2014 19:29    Scheduled Meds: . enoxaparin (LOVENOX) injection  40 mg Subcutaneous Q24H  . folic acid  1 mg Oral Daily  . piperacillin-tazobactam (ZOSYN)  IV  3.375 g Intravenous 3 times per day  . vancomycin  750 mg Intravenous Q12H   Continuous Infusions:   Principal Problem:   Sepsis Active Problems:   UTI (lower urinary tract infection)   Hypotension   AKI (acute kidney injury)   Anemia   Rheumatoid arthritis    Time spent:    [name]  [facility] Pager [phone] If 7PM-7AM, please contact night-coverage at [email or web address], password [email or web address] 10/15/2014, 3:35 PM  LOS: 1 day

## 2014-10-15 NOTE — Progress Notes (Signed)
ANTIBIOTIC CONSULT NOTE - FOLLOW UP  Pharmacy Consult for Vancomycin Indication: Sepsis  No Known Allergies  Patient Measurements: Height: 5\' 7"  (170.2 cm) Weight: 162 lb 4.1 oz (73.6 kg) IBW/kg (Calculated) : 61.6  Vital Signs: Temp: 97.9 F (36.6 C) (02/14 0845) Temp Source: Oral (02/14 0845) BP: 102/85 mmHg (02/14 0845) Pulse Rate: 71 (02/14 0845) Intake/Output from previous day: 02/13 0701 - 02/14 0700 In: 535.4 [I.V.:435.4; IV Piggyback:100] Out: 400 [Urine:400] Intake/Output from this shift: Total I/O In: 490 [P.O.:240; I.V.:250] Out: 300 [Urine:300]  Labs:  Recent Labs  10/14/14 1835 10/15/14 0305  WBC 32.9* 23.8*  HGB 10.7* 8.6*  PLT 343 274  CREATININE 1.54* 0.94   Estimated Creatinine Clearance: 62.7 mL/min (by C-G formula based on Cr of 0.94). No results for input(s): VANCOTROUGH, VANCOPEAK, VANCORANDOM, GENTTROUGH, GENTPEAK, GENTRANDOM, TOBRATROUGH, TOBRAPEAK, TOBRARND, AMIKACINPEAK, AMIKACINTROU, AMIKACIN in the last 72 hours.   Microbiology: Recent Results (from the past 720 hour(s))  MRSA PCR Screening     Status: None   Collection Time: 10/15/14  5:58 AM  Result Value Ref Range Status   MRSA by PCR NEGATIVE NEGATIVE Final    Comment:        The GeneXpert MRSA Assay (FDA approved for NASAL specimens only), is one component of a comprehensive MRSA colonization surveillance program. It is not intended to diagnose MRSA infection nor to guide or monitor treatment for MRSA infections.     Anti-infectives    Start     Dose/Rate Route Frequency Ordered Stop   10/15/14 1830  vancomycin (VANCOCIN) IVPB 1000 mg/200 mL premix     1,000 mg 200 mL/hr over 60 Minutes Intravenous Every 24 hours 10/14/14 2100     10/15/14 1000  hydroxychloroquine (PLAQUENIL) tablet 200 mg  Status:  Discontinued     200 mg Oral Daily 10/15/14 0120 10/15/14 0847   10/15/14 0300  piperacillin-tazobactam (ZOSYN) IVPB 3.375 g     3.375 g 12.5 mL/hr over 240 Minutes  Intravenous 3 times per day 10/15/14 0016     10/14/14 1930  vancomycin (VANCOCIN) 1,250 mg in sodium chloride 0.9 % 250 mL IVPB     1,250 mg 166.7 mL/hr over 90 Minutes Intravenous  Once 10/14/14 1853 10/14/14 2045   10/14/14 1900  piperacillin-tazobactam (ZOSYN) IVPB 3.375 g     3.375 g 100 mL/hr over 30 Minutes Intravenous  Once 10/14/14 1846 10/14/14 1935      Assessment: 58 yo F presents on 2/13 with elevated WBC. Two days ago her WBC count was 26, on recheck it was 36. Pt also complains of nausea and cough the past few days. Now day #2 for sepsis / UTI. WBC trending down to 23.8.  Renal function has improved, CrCl now ~ 39ml/min.  Goal of Therapy:  Vancomycin trough level 15-20 mcg/ml  Resolution of infection  Plan:  Increase vancomycin 750mg  IV Q12h Zosyn 3.375g IV Q8 Monitor renal function, clinical picture, VT at Css  Laura [name] J 10/15/2014,11:13 AM

## 2014-10-16 LAB — CBC
HEMATOCRIT: 26.6 % — AB (ref 36.0–46.0)
Hemoglobin: 9 g/dL — ABNORMAL LOW (ref 12.0–15.0)
MCH: 28.4 pg (ref 26.0–34.0)
MCHC: 33.8 g/dL (ref 30.0–36.0)
MCV: 83.9 fL (ref 78.0–100.0)
Platelets: 251 10*3/uL (ref 150–400)
RBC: 3.17 MIL/uL — ABNORMAL LOW (ref 3.87–5.11)
RDW: 16.9 % — ABNORMAL HIGH (ref 11.5–15.5)
WBC: 18.6 10*3/uL — AB (ref 4.0–10.5)

## 2014-10-16 LAB — BASIC METABOLIC PANEL
Anion gap: 8 (ref 5–15)
BUN: 11 mg/dL (ref 6–23)
CALCIUM: 8.4 mg/dL (ref 8.4–10.5)
CO2: 22 mmol/L (ref 19–32)
CREATININE: 0.75 mg/dL (ref 0.50–1.10)
Chloride: 109 mmol/L (ref 96–112)
GFR calc Af Amer: >90 mL/min (ref >90)
GLUCOSE: 104 mg/dL — AB (ref 70–99)
Potassium: 3.7 mmol/L (ref 3.5–5.1)
Sodium: 139 mmol/L (ref 135–145)

## 2014-10-16 LAB — URINE CULTURE: Colony Count: >=100000

## 2014-10-16 MED ORDER — SODIUM CHLORIDE 0.9 % IV SOLN
INTRAVENOUS | Status: AC
  Administered 2014-10-16: 75 mL/h via INTRAVENOUS

## 2014-10-16 MED ORDER — WHITE PETROLATUM GEL
Status: AC
Start: 2014-10-16 — End: 2014-10-16
  Administered 2014-10-16: 15:00:00
  Filled 2014-10-16: qty 1

## 2014-10-16 MED ORDER — OXYMETAZOLINE HCL 0.05 % NA SOLN
1.0000 | Freq: Two times a day (BID) | NASAL | Status: DC
  Filled 2014-10-16: qty 15

## 2014-10-16 NOTE — Progress Notes (Signed)
Patient ID: Laura [name], female   DOB: July 22, 1956, 59 y.o.   MRN: [id number]  [facility] PROGRESS NOTE  Laura [name] VQQ:[id number] DOB: 1955-10-19 DOA: 10/14/2014 PCP: Valinda Hoar, PA-C   Brief narrative:    59 y.o. female with RA, presented to [facility] ED from PCP office for further evaluation of leukocytosis and progressive nausea, poor oral intake, non productive cough. In ED, pt noted to be hypotensive, UA suggestive of UTI, sepsis work up initiated. Pt started on Vancomycin and Zosyn, IVF, clinically improving.   Assessment/Plan:    Sepsis/ Septic Shock, secondary to ? UTI - urine culture obtained on admission with multiple bacterial morphotype's present and recollection of the sample recommended  - continue vancomycin and zosyn for now and plan on tapering in next 24 - 48 hours if appropriate  - request repeat urine culture but may be negative as pt already on ABX - WBC continue trending down: 36 --> 23 --> 18 this MA - repeat CBC in AM, stable for transfer to telemetry bed  UTI (lower urinary tract infection) - ABX as noted above and recollect urine sample for analysis 2/14  Hypotension - secondary to sepsis - BP more stable  - continue to home home regimen Metoprolol and Imdur Acute renal failure  - secondary to sepsis  - IVF provided and Cr is now WNL - repeat BMP in AM  Anemia of chronic disease, RA - iron level is WNL - no signs of active bleeding, Hg drop since admission is likely dilutional from IVF pt ha been receiving  Rheumatoid arthritis -  hold Plaquenil 2/2 infection DVT Prophylaxis  - Lovenox SQ  Code Status: Full.  Family Communication:  plan of care discussed with the patient Disposition Plan: Stable for transfer to telemetry bed   IV access:  Peripheral IV  Procedures and diagnostic studies:    Dg Chest Port 1 View No edema or consolidation.    Medical Consultants:  None  Other Consultants:  None  IAnti-Infectives:    Vancomycin 2/13 --> Zosyn 2/13  Debbora Presto, MD  [facility] Pager [phone]  If 7PM-7AM, please contact night-coverage [email or web address] Password [email or web address] 10/16/2014, 12:00 PM   LOS: 2 days   HPI/Subjective: No events overnight.   Objective: Filed Vitals:   10/16/14 0319 10/16/14 0717 10/16/14 0718 10/16/14 1100  BP: 104/55 98/59  109/69  Pulse: 70  68 70  Temp: 99.4 F (37.4 C)  99.5 F (37.5 C)   TempSrc: Oral  Oral   Resp: 21 15 14 16   Height:      Weight:      SpO2: 98%  99%     Intake/Output Summary (Last 24 hours) at 10/16/14 1200 Last data filed at 10/16/14 0900  Gross per 24 hour  Intake 1648.33 ml  Output   1050 ml  Net 598.33 ml    Exam:   General:  Pt is alert, follows commands appropriately, not in acute distress  Cardiovascular: Regular rate and rhythm, S1/S2, no rubs, no gallops  Respiratory: Clear to auscultation bilaterally, no wheezing, diminished breath sounds at bases   Abdomen: Soft, non tender, non distended, bowel sounds present, no guarding  Extremities:  pulses DP and PT palpable bilaterally  Neuro: Grossly nonfocal  Data Reviewed: Basic Metabolic Panel:  Recent Labs Lab 10/14/14 1835 10/15/14 0305 10/16/14 0257  NA 137 140 139  K 4.0 3.9 3.7  CL 101 109 109  CO2 23 26 22   GLUCOSE 106* 110* 104*  BUN  24* 22 11  CREATININE 1.54* 0.94 0.75  CALCIUM 9.6 8.6 8.4   Liver Function Tests:  Recent Labs Lab 10/14/14 1835  AST 89*  ALT 27  ALKPHOS 58  BILITOT 1.1  PROT 7.4  ALBUMIN 4.4   CBC:  Recent Labs Lab 10/14/14 1835 10/15/14 0305 10/16/14 0257  WBC 32.9* 23.8* 18.6*  NEUTROABS 26.9*  --   --   HGB 10.7* 8.6* 9.0*  HCT 31.3* 25.6* 26.6*  MCV 83.7 83.1 83.9  PLT 343 274 251   Recent Results (from the past 240 hour(s))  Blood Culture (routine x 2)     Status: None (Preliminary result)   Collection Time: 10/14/14  6:35 PM  Result Value Ref Range Status   Specimen Description BLOOD RIGHT ARM  Final    Special Requests BOTTLES DRAWN AEROBIC AND ANAEROBIC 5CC  Final   Culture   Final           BLOOD CULTURE RECEIVED NO GROWTH TO DATE CULTURE WILL BE HELD FOR 5 DAYS BEFORE ISSUING A FINAL NEGATIVE REPORT Performed at [facility]    Report Status PENDING  Incomplete  Blood Culture (routine x 2)     Status: None (Preliminary result)   Collection Time: 10/14/14  6:45 PM  Result Value Ref Range Status   Specimen Description BLOOD LEFT ARM  Final   Special Requests BOTTLES DRAWN AEROBIC AND ANAEROBIC 5CC  Final   Culture   Final           BLOOD CULTURE RECEIVED NO GROWTH TO DATE CULTURE WILL BE HELD FOR 5 DAYS BEFORE ISSUING A FINAL NEGATIVE REPORT Performed at [facility]    Report Status PENDING  Incomplete  Urine culture     Status: None   Collection Time: 10/14/14  9:35 PM  Result Value Ref Range Status   Specimen Description URINE, CLEAN CATCH  Final   Special Requests NONE  Final   Colony Count   Final    >=100,000 COLONIES/ML Performed at [facility]    Culture   Final    Multiple bacterial morphotypes present, none predominant. Suggest appropriate recollection if clinically indicated. Performed at [facility]    Report Status 10/16/2014 FINAL  Final  MRSA PCR Screening     Status: None   Collection Time: 10/15/14  5:58 AM  Result Value Ref Range Status   MRSA by PCR NEGATIVE NEGATIVE Final     Scheduled Meds: . enoxaparin (LOVENOX) injection  40 mg Subcutaneous Q24H  . folic acid  1 mg Oral Daily  . oxymetazoline  1 spray Each Nare BID  . piperacillin-tazobactam (ZOSYN)  IV  3.375 g Intravenous 3 times per day  . vancomycin  750 mg Intravenous Q12H   Continuous Infusions:

## 2014-10-17 LAB — CBC
HCT: 27.2 % — ABNORMAL LOW (ref 36.0–46.0)
Hemoglobin: 9.2 g/dL — ABNORMAL LOW (ref 12.0–15.0)
MCH: 28 pg (ref 26.0–34.0)
MCHC: 33.8 g/dL (ref 30.0–36.0)
MCV: 82.7 fL (ref 78.0–100.0)
PLATELETS: 282 10*3/uL (ref 150–400)
RBC: 3.29 MIL/uL — AB (ref 3.87–5.11)
RDW: 16.3 % — AB (ref 11.5–15.5)
WBC: 17.2 10*3/uL — AB (ref 4.0–10.5)

## 2014-10-17 LAB — BASIC METABOLIC PANEL
Anion gap: 11 (ref 5–15)
BUN: 6 mg/dL (ref 6–23)
CO2: 23 mmol/L (ref 19–32)
Calcium: 8.8 mg/dL (ref 8.4–10.5)
Chloride: 108 mmol/L (ref 96–112)
Creatinine, Ser: 0.74 mg/dL (ref 0.50–1.10)
GFR calc Af Amer: >90 mL/min (ref >90)
GFR calc non Af Amer: >90 mL/min (ref >90)
GLUCOSE: 102 mg/dL — AB (ref 70–99)
POTASSIUM: 3.3 mmol/L — AB (ref 3.5–5.1)
SODIUM: 142 mmol/L (ref 135–145)

## 2014-10-17 LAB — URINE CULTURE
Colony Count: NO GROWTH
Culture: NO GROWTH

## 2014-10-17 MED ORDER — CIPROFLOXACIN HCL 500 MG PO TABS: 500.0000 mg | ORAL_TABLET | Freq: Two times a day (BID) | ORAL | Status: AC

## 2014-10-17 MED ORDER — HYDROXYCHLOROQUINE SULFATE 200 MG PO TABS: 200.0000 mg | ORAL_TABLET | Freq: Every day | ORAL | Status: AC

## 2014-10-17 MED ORDER — POTASSIUM CHLORIDE CRYS ER 20 MEQ PO TBCR: 40.0000 meq | EXTENDED_RELEASE_TABLET | Freq: Once | ORAL | Status: DC

## 2014-10-17 NOTE — Progress Notes (Signed)
Pt discharged per MD order and protocol. Discharge instructions reviewed with patient and all questions answered. Pt given all prescriptions and aware of follow up appointments.  

## 2014-10-17 NOTE — Progress Notes (Signed)
Pt states she cannot take Potassium. MD paged and aware. No new orders given at this time.

## 2014-10-17 NOTE — Discharge Summary (Signed)
Physician Discharge Summary  Laura [name] AOZ:[id number] DOB: 1956/01/07 DOA: 10/14/2014  PCP: Valinda Hoar, PA-C  Admit date: 10/14/2014 Discharge date: 10/17/2014  Recommendations for Outpatient Follow-up:  1. Pt will need to follow up with PCP in 2-3 weeks post discharge 2. Please obtain BMP to evaluate electrolytes and kidney function 3. Pt advised to take K-dur prior to discharge but refused indicating she was told she is not supposed to take any potassium  4. Please also check CBC to evaluate Hg and Hct levels 5. Continue Cipro upon discharge for 7 more days upon discharge   Discharge Diagnoses:  Principal Problem:   Sepsis Active Problems:   UTI (lower urinary tract infection)   Hypotension   AKI (acute kidney injury)   Anemia   Rheumatoid arthritis   Discharge Condition: Stable  Diet recommendation: Heart healthy diet discussed in details     Brief narrative:    59 y.o. female with RA, presented to [facility] ED from PCP office for further evaluation of leukocytosis and progressive nausea, poor oral intake, non productive cough. In ED, pt noted to be hypotensive, UA suggestive of UTI, sepsis work up initiated. Pt started on Vancomycin and Zosyn, IVF, clinically improving.   Assessment/Plan:    Sepsis/ Septic Shock, secondary to ? UTI - urine culture obtained on admission with multiple bacterial morphotype's present and recollection of the sample recommended  - continued vancomycin and zosyn and pt now feels better, insisting on going home today - will transition to oral Cipro to complete therapy for 7 more days post discharge  - request repeat urine culture but may be negative as pt already on ABX - WBC continue trending down: 36 --> 23 --> 18 --> 17  UTI (lower urinary tract infection) - ABX as noted above and recollected urine sample for analysis 2/14  Hypotension - secondary to sepsis - BP more stable  - continue home regimen Metoprolol and  Imdur Acute renal failure  - secondary to sepsis  - IVF provided and Cr is now WNL  Anemia of chronic disease, RA - iron level is WNL - no signs of active bleeding, Hg drop since admission is likely dilutional from IVF pt ha been receiving  Rheumatoid arthritis - held Plaquenil 2/2 infection and sepsis   Code Status: Full.  Family Communication: plan of care discussed with the patient Disposition Plan: wants to go home   IV access:  Peripheral IV  Procedures and diagnostic studies:   Dg Chest Port 1 View No edema or consolidation.   Medical Consultants:  None  Other Consultants:  None  IAnti-Infectives:   Vancomycin 2/13 --> 2/16 Zosyn 2/13 --> 2/16 Cipro 2/16 --> 7 more days        Discharge Exam: Filed Vitals:   10/17/14 1006  BP: 129/68  Pulse: 74  Temp: 98.7 F (37.1 C)  Resp: 18   Filed Vitals:   10/16/14 1100 10/16/14 2025 10/17/14 0406 10/17/14 1006  BP: 109/69 111/66 125/76 129/68  Pulse: 70 72 71 74  Temp: 99.3 F (37.4 C) 99.4 F (37.4 C) 98.9 F (37.2 C) 98.7 F (37.1 C)  TempSrc: Oral Oral Oral   Resp: 16 17 18 18   Height:      Weight:      SpO2:  93% 96% 99%    General: Pt is alert, follows commands appropriately, not in acute distress Cardiovascular: Regular rate and rhythm, S1/S2 +, no murmurs, no rubs, no gallops Respiratory: Clear to auscultation bilaterally, no  wheezing, no crackles, no rhonchi Abdominal: Soft, non tender, non distended, bowel sounds +, no guarding Extremities: no edema, no cyanosis, pulses palpable bilaterally DP and PT Neuro: Grossly nonfocal  Discharge Instructions     Medication List    TAKE these medications        ciprofloxacin 500 MG tablet  Commonly known as:  CIPRO  Take 1 tablet (500 mg total) by mouth 2 (two) times daily.     folic acid 1 MG tablet  Commonly known as:  FOLVITE  Take 1 mg by mouth daily.     HYDROcodone-acetaminophen 5-500 MG per tablet  Commonly known  as:  LORTAB 5  Take 1 tablet by mouth every 4 (four) hours as needed for pain.     hydroxychloroquine 200 MG tablet  Commonly known as:  PLAQUENIL  Take 1 tablet (200 mg total) by mouth daily. Start taking after completion of antibiotics     ibuprofen 800 MG tablet  Commonly known as:  ADVIL,MOTRIN  Take 1 tablet (800 mg total) by mouth 3 (three) times daily.     isosorbide mononitrate 30 MG 24 hr tablet  Commonly known as:  IMDUR  Take 30 mg by mouth daily.     metoprolol succinate 50 MG 24 hr tablet  Commonly known as:  TOPROL-XL  Take 50 mg by mouth daily. Take with or immediately following a meal.     promethazine 12.5 MG tablet  Commonly known as:  PHENERGAN  Take 12.5 mg by mouth every 6 (six) hours as needed.     traMADol 50 MG tablet  Commonly known as:  ULTRAM  Take 1 tablet (50 mg total) by mouth every 6 (six) hours as needed for pain.           Follow-up Information    Follow up with Valinda Hoar, PA-C.   Contact information:   [address] [place] [address] [phone]       Follow up with Debbora Presto, MD.   Specialty:  Internal Medicine   Why:  As needed call my cell phone [phone]   Contact information:   [address] [place] [address] [phone]        The results of significant diagnostics from this hospitalization (including imaging, microbiology, ancillary and laboratory) are listed below for reference.     Microbiology: Recent Results (from the past 240 hour(s))  Blood Culture (routine x 2)     Status: None (Preliminary result)   Collection Time: 10/14/14  6:35 PM  Result Value Ref Range Status   Specimen Description BLOOD RIGHT ARM  Final   Special Requests BOTTLES DRAWN AEROBIC AND ANAEROBIC 5CC  Final   Culture   Final           BLOOD CULTURE RECEIVED NO GROWTH TO DATE CULTURE WILL BE HELD FOR 5 DAYS BEFORE ISSUING A FINAL NEGATIVE REPORT Performed at [facility]    Report  Status PENDING  Incomplete  Blood Culture (routine x 2)     Status: None (Preliminary result)   Collection Time: 10/14/14  6:45 PM  Result Value Ref Range Status   Specimen Description BLOOD LEFT ARM  Final   Special Requests BOTTLES DRAWN AEROBIC AND ANAEROBIC 5CC  Final   Culture   Final           BLOOD CULTURE RECEIVED NO GROWTH TO DATE CULTURE WILL BE HELD FOR 5 DAYS BEFORE ISSUING A FINAL NEGATIVE REPORT Performed  at [facility]    Report Status PENDING  Incomplete  Urine culture     Status: None   Collection Time: 10/14/14  9:35 PM  Result Value Ref Range Status   Specimen Description URINE, CLEAN CATCH  Final   Special Requests NONE  Final   Colony Count   Final    >=100,000 COLONIES/ML Performed at [facility]    Culture   Final    Multiple bacterial morphotypes present, none predominant. Suggest appropriate recollection if clinically indicated. Performed at [facility]    Report Status 10/16/2014 FINAL  Final  MRSA PCR Screening     Status: None   Collection Time: 10/15/14  5:58 AM  Result Value Ref Range Status   MRSA by PCR NEGATIVE NEGATIVE Final    Comment:        The GeneXpert MRSA Assay (FDA approved for NASAL specimens only), is one component of a comprehensive MRSA colonization surveillance program. It is not intended to diagnose MRSA infection nor to guide or monitor treatment for MRSA infections.      Labs: Basic Metabolic Panel:  Recent Labs Lab 10/14/14 1835 10/15/14 0305 10/16/14 0257 10/17/14 0455  NA 137 140 139 142  K 4.0 3.9 3.7 3.3*  CL 101 109 109 108  CO2 23 26 22 23   GLUCOSE 106* 110* 104* 102*  BUN 24* 22 11 6   CREATININE 1.54* 0.94 0.75 0.74  CALCIUM 9.6 8.6 8.4 8.8   Liver Function Tests:  Recent Labs Lab 10/14/14 1835  AST 89*  ALT 27  ALKPHOS 58  BILITOT 1.1  PROT 7.4  ALBUMIN 4.4   CBC:  Recent Labs Lab 10/14/14 1835 10/15/14 0305 10/16/14 0257 10/17/14 0455  WBC 32.9* 23.8*  18.6* 17.2*  NEUTROABS 26.9*  --   --   --   HGB 10.7* 8.6* 9.0* 9.2*  HCT 31.3* 25.6* 26.6* 27.2*  MCV 83.7 83.1 83.9 82.7  PLT 343 274 251 282   SIGNED: Time coordinating discharge: Over 30 minutes  Debbora Presto, MD  [facility] 10/17/2014, 10:27 AM Pager [phone]  If 7PM-7AM, please contact night-coverage [email or web address] Password [email or web address]

## 2014-10-17 NOTE — Discharge Instructions (Signed)

## 2014-10-21 LAB — CULTURE, BLOOD (ROUTINE X 2)
Culture: NO GROWTH
Culture: NO GROWTH

## 2016-03-04 IMAGING — US US THYROID BIOPSY
1 series · 11 of 11 positions shown · non-contrast
Comparison: none

CLINICAL DATA: Dominant lower pole left thyroid nodule.

EXAM:
ULTRASOUND-GUIDED THYROID ASPIRATION BIOPSY
TECHNIQUE: The procedure, risks (including but not limited to bleeding,
infection, organ damage ), benefits, and alternatives were explained
to the patient. Questions regarding the procedure were encouraged
and answered. The patient understands and consents to the procedure.

[Series 1: us thyroid biopsy · 0.07mm/px · 11 acquisitions, 11 frames shown]
[im 1/11]
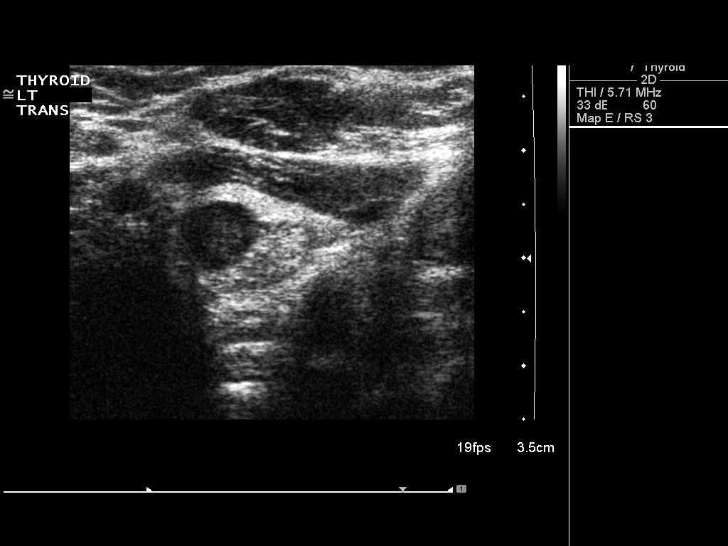
[im 2/11]
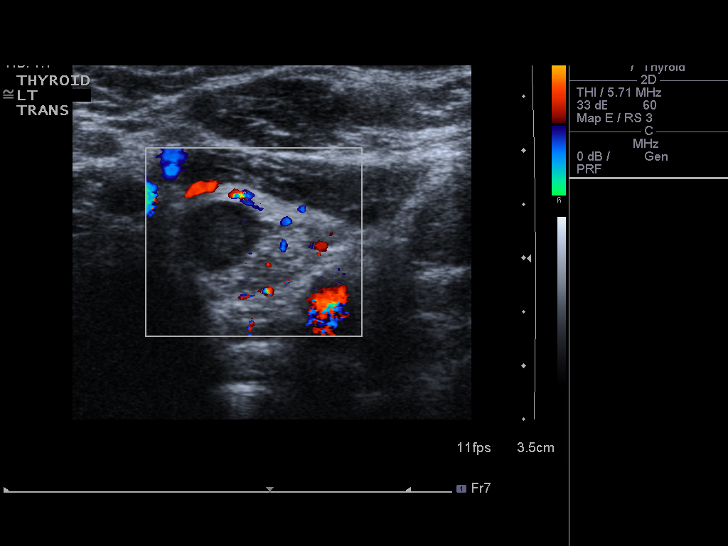
[im 3/11]
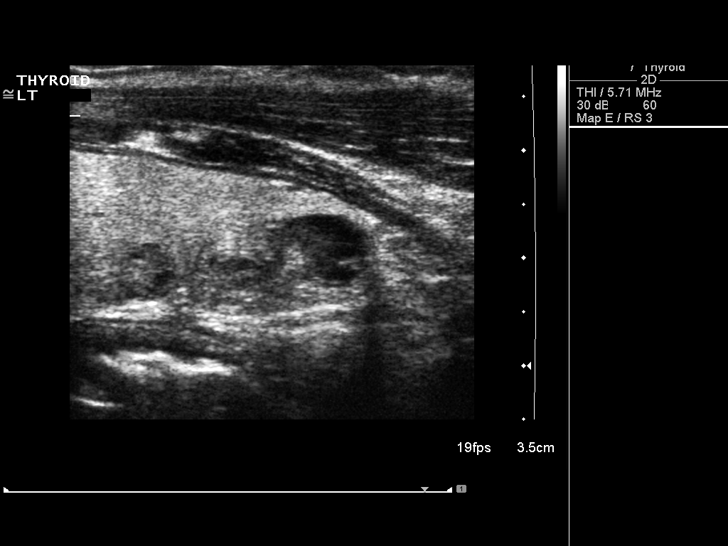
[im 4/11]
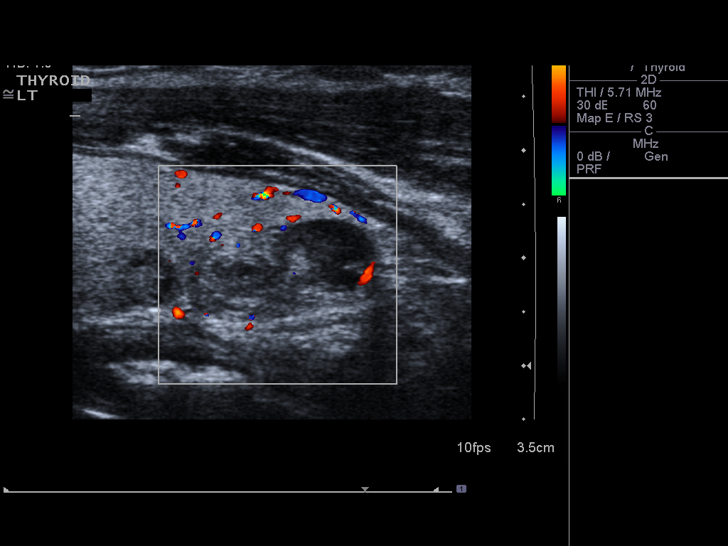
[im 5/11]
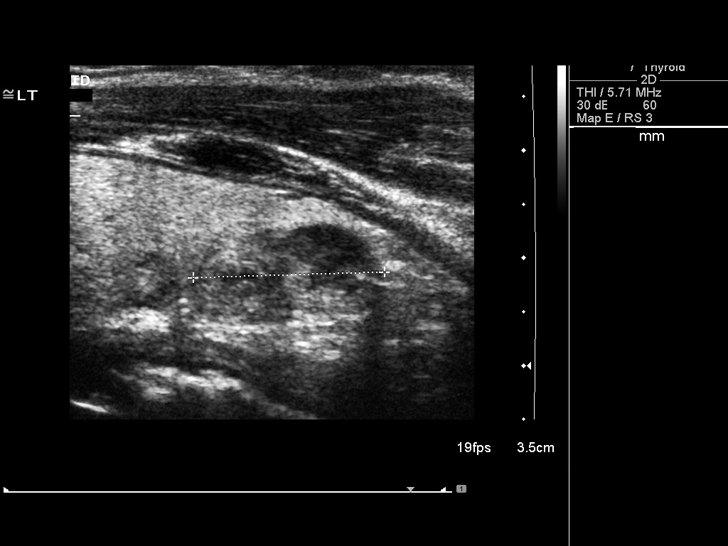
[im 6/11]
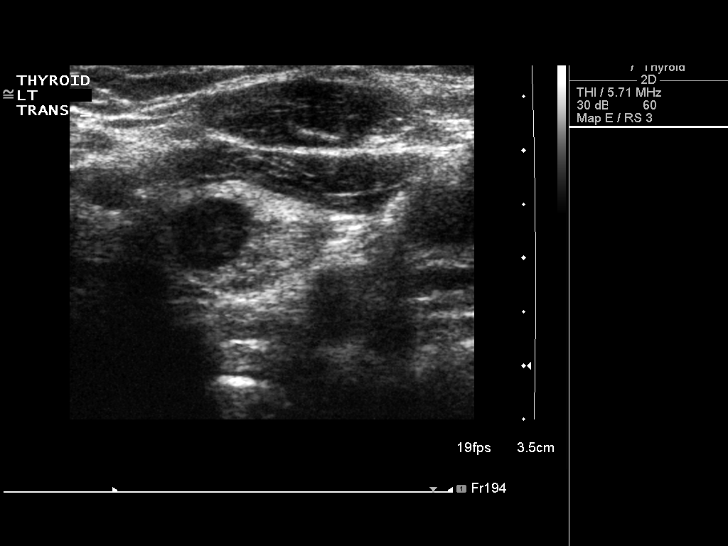
[im 7/11]
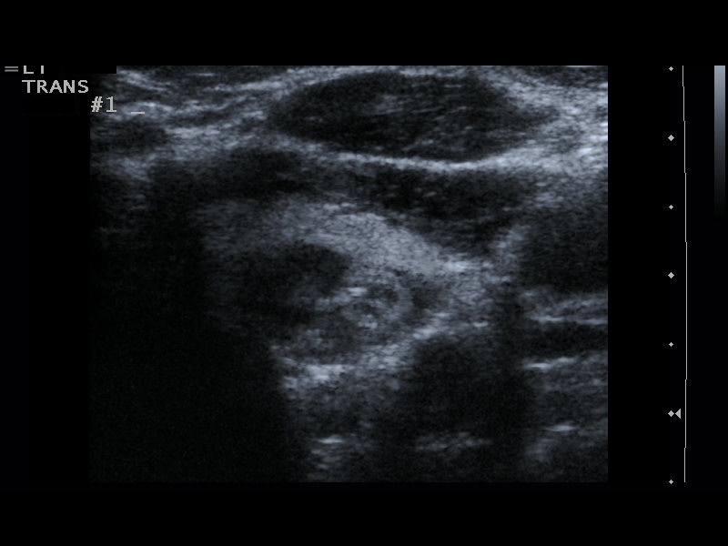
[im 8/11]
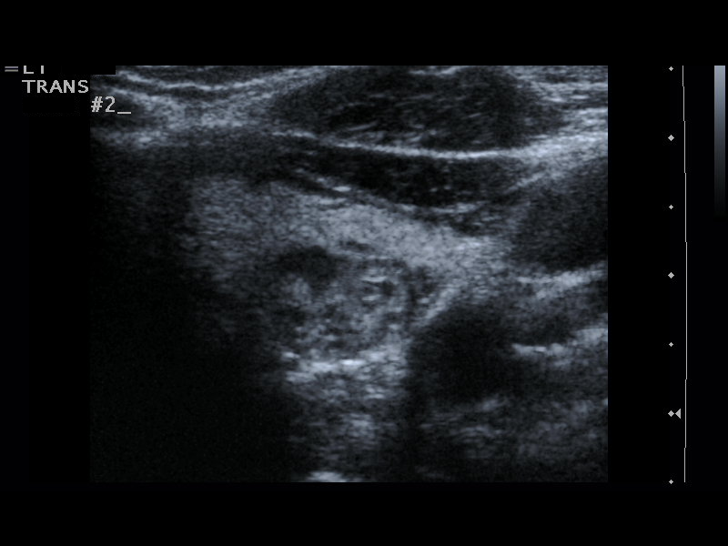
[im 9/11]
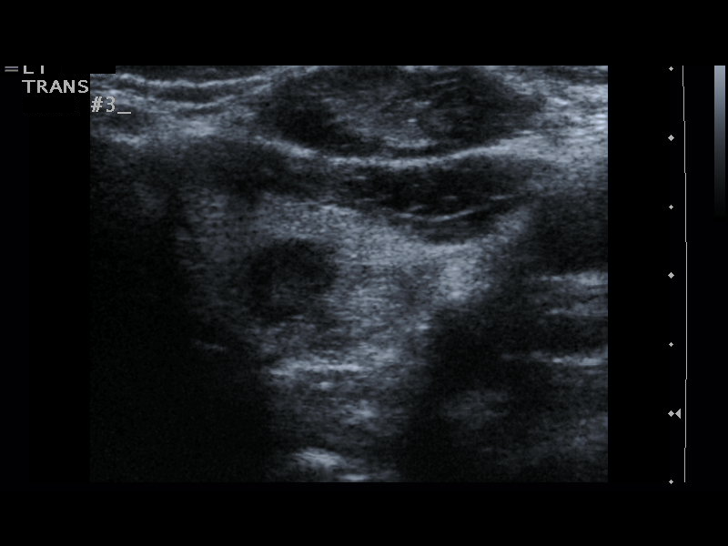
[im 10/11]
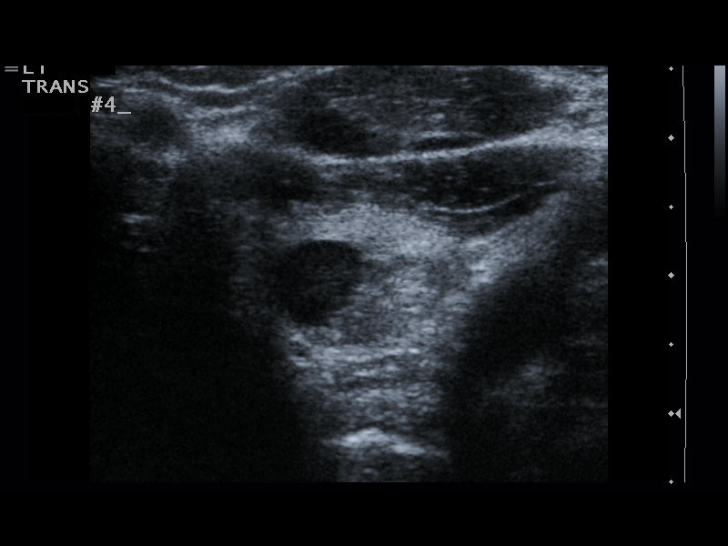
[im 11/11]
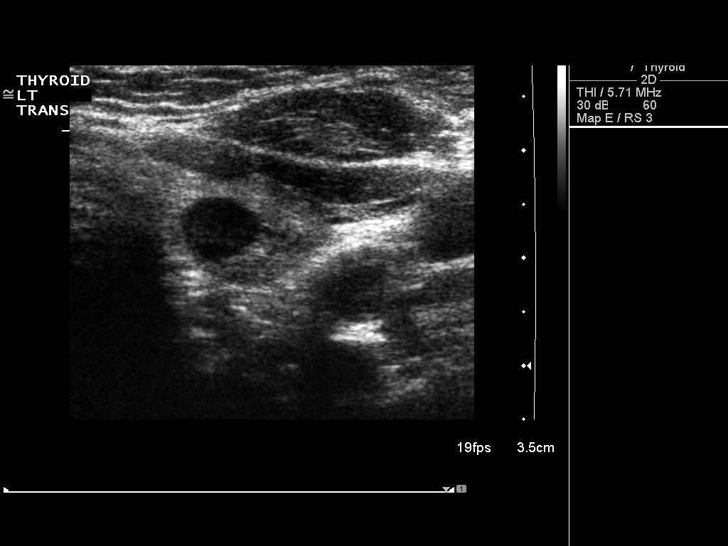

[11 of 11 positions shown; findings below may reference images not displayed]

Survey ultrasound was performed and the dominant lesion in the the
inferior pole left lobe was localized. An appropriate skin entry
site was determined. Skin was marked, then prepped with Betadine,
draped in usual sterile fashion, and infiltrated locally with 1%
lidocaine. Under real-time ultrasound guidance, 4 passes were made
into the lesion with 25 gauge needles. The patient tolerated
procedure well.

COMPLICATIONS:
COMPLICATIONS
none
IMPRESSION: 1. Technically successful ultrasound-guided thyroid aspiration
biopsy , dominant inferior left thyroid nodule.

## 2016-04-04 IMAGING — CR DG CHEST 1V PORT
1 series · 1 of 1 positions shown · non-contrast
Comparison: December 30, 2006

CLINICAL DATA: Fatigue and difficulty breathing

EXAM:
PORTABLE CHEST - 1 VIEW

[AP]
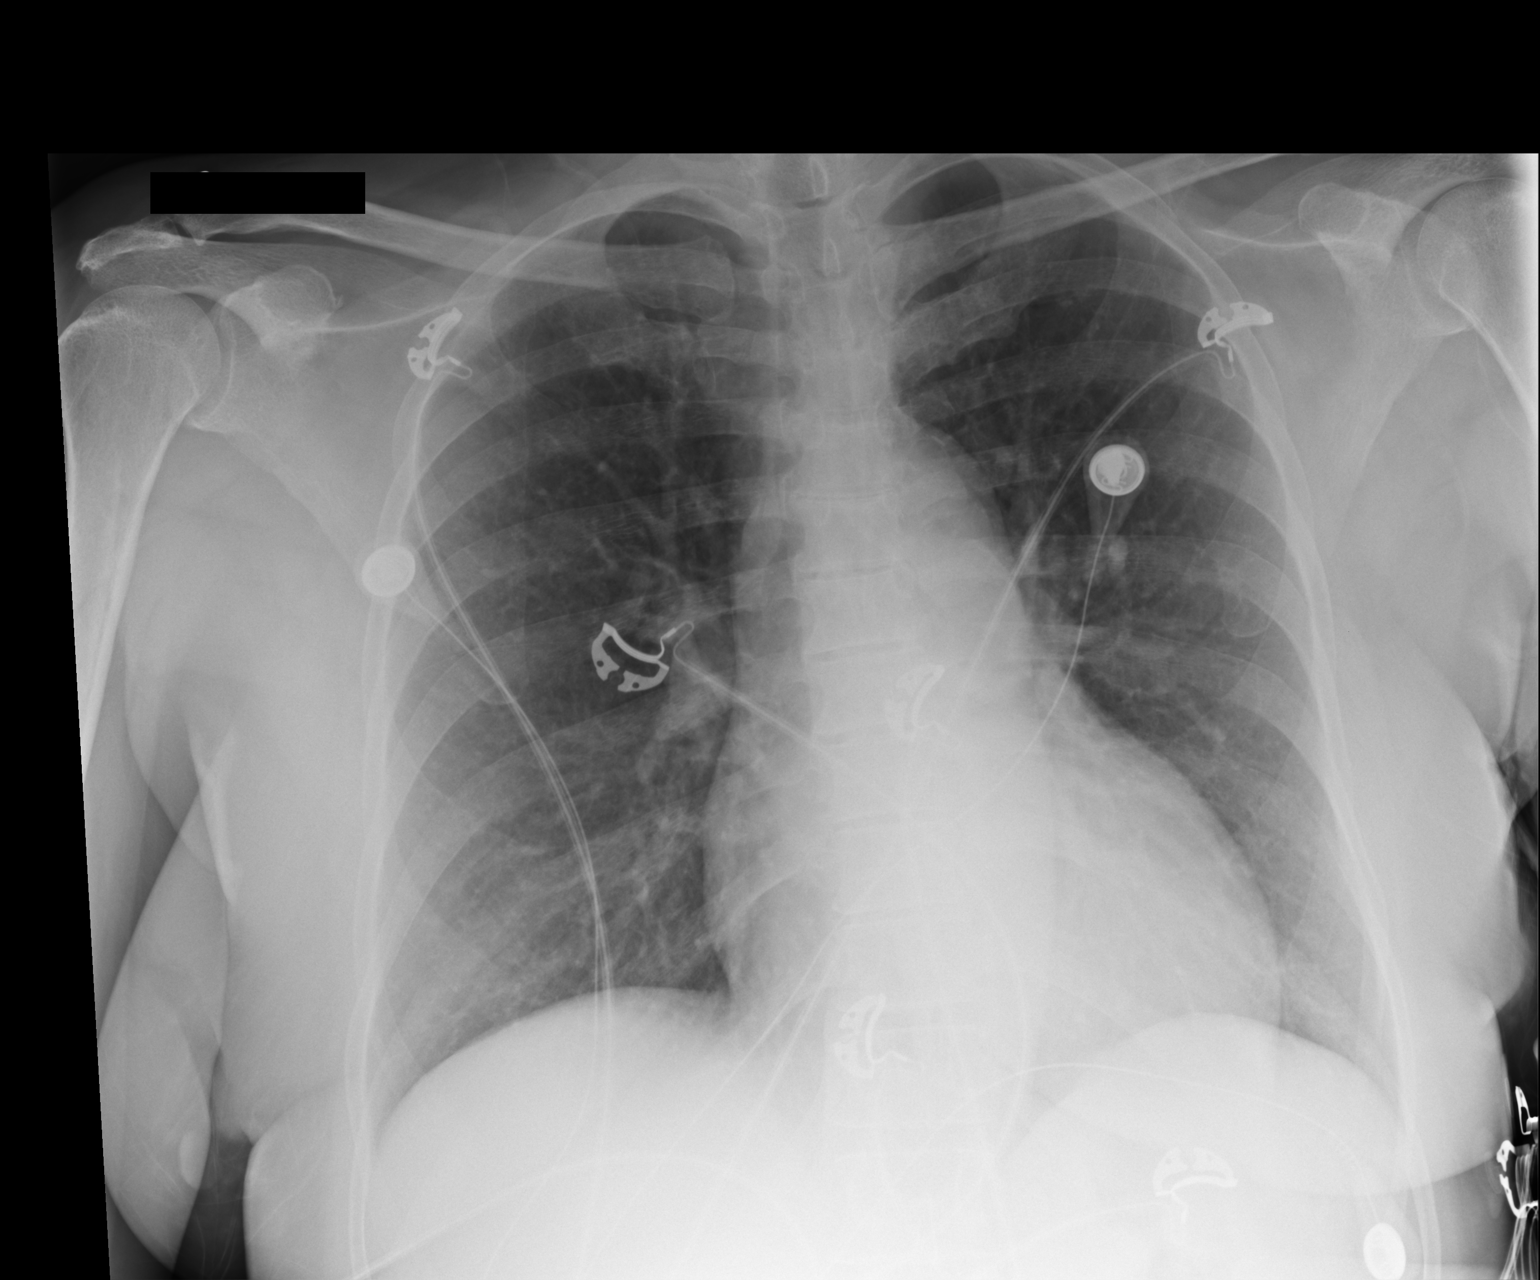

[1 of 1 positions shown; findings below may reference images not displayed]

FINDINGS: Lungs are clear. Heart is upper normal in size with pulmonary
vascularity within normal limits. No adenopathy. No bone lesions.
IMPRESSION: No edema or consolidation.

## 2016-09-15 ENCOUNTER — Encounter: Payer: Self-pay | Admitting: Gastroenterology

## 2017-12-30 DEATH — deceased
